# Patient Record
Sex: Female | Born: 1980 | Race: White | Hispanic: No | Smoking: Never smoker
Health system: Southern US, Community
[De-identification: ages and names within clinical notes are randomized; demographics above are authoritative.]

## PROBLEM LIST (undated history)

## (undated) DIAGNOSIS — D649 Anemia, unspecified: Secondary | ICD-10-CM

## (undated) DIAGNOSIS — R011 Cardiac murmur, unspecified: Secondary | ICD-10-CM

## (undated) HISTORY — DX: Cardiac murmur, unspecified: R01.1

## (undated) HISTORY — DX: Anemia, unspecified: D64.9

---

## 2010-05-11 ENCOUNTER — Ambulatory Visit: Payer: Self-pay | Admitting: Family Medicine

## 2010-05-11 DIAGNOSIS — L255 Unspecified contact dermatitis due to plants, except food: Secondary | ICD-10-CM

## 2010-05-18 ENCOUNTER — Ambulatory Visit: Payer: Self-pay | Admitting: Family Medicine

## 2010-05-31 ENCOUNTER — Ambulatory Visit: Payer: Self-pay | Admitting: Family Medicine

## 2010-11-26 NOTE — Assessment & Plan Note (Signed)
Summary: Gina Le/EVM   Vital Signs:  Patient profile:   30 year old female Height:      66 inches Weight:      204 pounds BMI:     33.05 O2 Sat:      100 % on Room air Temp:     97.0 degrees F oral Pulse rate:   100 / minute Pulse rhythm:   regular Resp:     20 per minute BP sitting:   145 / 98  (left arm)  Vitals Entered By: Levonne Spiller EMT-P (May 31, 2010 1:12 PM)  O2 Flow:  Room air  Allergies: No Known Drug Allergies  Family History: Reviewed history from 05/11/2010 and no changes required. Father: alive and well at 20 yoa Mother: alive and well at 75 yoa Siblings: 2 sisters one 46 and one 39 alive and well  Social History: Reviewed history from 05/11/2010 and no changes required. Marital Status: Married Children: 1 son and daughter Occupation: children   Complete Medication List: 1)  Benadryl 25 Mg Tabs (Diphenhydramine hcl) .... Q 6 hours prn 2)  Dexpak 6 Day 1.5 Mg Tabs (Dexamethasone) .... Take as directed 3)  Cephalexin 500 Mg Tabs (Cephalexin) .... Take 1 by mouth every 6 hours for 5 days 4)  Hydroxyzine Hcl 25 Mg Tabs (Hydroxyzine hcl) .... Take 1 by mouth q 8 hours as needed itching, caution: will cause drowsiness 5)  Triamcinolone Acetonide 0.5 % Crea (Triamcinolone acetonide) .... Apply sparingly to rash two times a day caution: avoid putting on face and genital areas 6)  Prednisone 10 Mg  .... Sig 6 tablets day 1&2 5 tablets day 3&4  4 tablets day 5&6 3 tablets day 7&8 2 tablets day 9 &10 1 tablet day 11 & 12 take all to prevent rebound 7)  Prednisone 10 Mg Tabs (Prednisone) .... 5 for 5 days, then 4 for 4 days, 3 for 3 days, 2 for 2 days then 1.  Other Orders: Depo- Medrol 80mg  (J1040) Admin of Therapeutic Inj  intramuscular or subcutaneous (16109)  Patient Instructions: 1)  Wash all clothing that was exposed to the allergen to prevent re-infection. 2)  For relief of itching, use cold compresses to areas, oatmeal bath soaks, and if you were  prescribed topical medicine, store it and use it from the refridgerator.  Prescriptions: TRIAMCINOLONE ACETONIDE 0.5 % CREA (TRIAMCINOLONE ACETONIDE) apply sparingly to rash two times a day Caution: avoid putting on face and genital areas  #40 gm x 0   Entered and Authorized by:   Tacey Ruiz MD   Signed by:   Tacey Ruiz MD on 05/31/2010   Method used:   Electronically to        [facility] [address] (retail)       [address], [address]       [place], [place]  [address]       Ph: [phone]       Fax: [phone]   RxID:   [id number] PREDNISONE 10 MG TABS (PREDNISONE) 5 for 5 days, then 4 for 4 days, 3 for 3 days, 2 for 2 days then 1.  #55 x 0   Entered and Authorized by:   Tacey Ruiz MD   Signed by:   Tacey Ruiz MD on 05/31/2010   Method used:   Electronically to        [facility] [address] (retail)  [address], [address]       [place], [place]  [address]       Ph: [phone]       Fax: [phone]   RxID:   [id number]   Assessment New Problems: CONTACT DERMATITIS DUE TO POISON Le (ICD-692.6)   Plan New Medications/Changes: TRIAMCINOLONE ACETONIDE 0.5 % CREA (TRIAMCINOLONE ACETONIDE) apply sparingly to rash two times a day Caution: avoid putting on face and genital areas  #40 gm x 0, 05/31/2010, Tacey Ruiz MD PREDNISONE 10 MG TABS (PREDNISONE) 5 for 5 days, then 4 for 4 days, 3 for 3 days, 2 for 2 days then 1.  #55 x 0, 05/31/2010, Tacey Ruiz MD  New Orders: Depo- Medrol 80mg  [J1040] Est. Patient Level II [54270] Admin of Therapeutic Inj  intramuscular or subcutaneous [96[address]] Planning Comments:   She was instructed to f/u with dermatology if this recurs. She does not have insurance/has recently applied for Medicaid. Encouraged to again wash fomites.   The patient and/or caregiver has been counseled thoroughly with regard to medications prescribed including dosage, schedule, interactions,  rationale for use, and possible side effects and they verbalize understanding.  Diagnoses and expected course of recovery discussed and will return if not improved as expected or if the condition worsens. Patient and/or caregiver verbalized understanding.       Medication Administration  Injection # 1:    Medication: Depo- Medrol 80mg     Diagnosis: CONTACT DERMATITIS DUE TO POISON Le (ICD-692.6)    Route: IM    Site: RUOQ gluteus    Exp Date: 09/25/2010    Lot #: [id number]    Mfr: PFIZER    Patient tolerated injection without complications    Given by: Levonne Spiller EMT-P (May 31, 2010 1:49 PM)  Orders Added: 1)  Depo- Medrol 80mg  [J1040] 2)  Est. Patient Level II [28315] 3)  Admin of Therapeutic Inj  intramuscular or subcutaneous [96372]  History of Present Illness History from: patient Reason for visit: see chief complaint Chief Complaint: Return of poison Le History of Present Illness: Patient returns today with recurrence of contact dermatitis. She was treated here twice for this and each time she gets to the end of her taper the rash recurrs. She reports that she has washed her clothes and fomites and the kids undress at the door when they come in from outside. She reports that she has not been outside in 3 weeks except to go to the store, etc, not in the yard.     Physical Exam General appearance: well developed, well nourished, uncomfortable secondary to itching. Abdomen: buising and striae present Skin: erythematous mp rash in patches on tops of thighs, flexor surfaces of bilateral arms - diffusely on back and more coelesced on chest and around neck - very erythematous. Some mottling of legs. MSE: oriented to time, place, and person - frustrated   REVIEW OF SYSTEMS Constitutional Symptoms      Denies fever, chills, night sweats, and fatigue.   Respiratory       Denies dry cough, productive cough, wheezing, and shortness of breath.     Skin       Complains of  bruising.   The patient was informed that there is no on-call provider or services available at this clinic during off-hours (when the clinic is closed).  If the patient developed a problem or concern that required immediate attention, the patient was advised to go the  the nearest available urgent care or emergency department for medical care.  The patient verbalized understanding.     The risks, benefits and possible side effects of the treatments and tests were explained clearly to the patient and the patient verbalized understanding.

## 2010-11-26 NOTE — Assessment & Plan Note (Signed)
Summary: POISION OAK- jbb   Vital Signs:  Patient profile:   30 year old female Height:      66.50 inches Weight:      202 pounds BMI:     32.23 O2 Sat:      100 % on Room air Pulse rate:   95 / minute Resp:     16 per minute BP sitting:   130 / 90  (left arm)  Vitals Entered By: Adella Hare LPN (May 18, 2010 2:55 PM)  O2 Flow:  Room air Physical Exam General appearance: well developed, well nourished, moderate  distress Head: normocephalic, atraumatic Extremities: normal extremities Skin: see below MSE: oriented to time, place, and person Patient  now w/ rash still on both forearms  and now on both thighs . There was a rash on the lateral aspect of both thighs and now w/ a new rash on both anterior thighs.   CC: poison oak follow up Is Patient Diabetic? No Pain Assessment Patient in pain? yes     Location: thigh Type: stinging   Chief Complaint:  poison oak follow up.  History of Present Illness: Patient poisin ivy/contact dermatititis has returned w/ a vengence. She ran out of her prednisone on Thursday and today Saturday the rash has reoccurred and is worse. She was only given 21 prednisone tablets that were quickly tapered.   Problems Prior to Update: 1)  Contact Dermatitis Due To Poison Ivy  (ICD-692.6) 2)  Contact Dermatitis&other Eczema Due To Plants  (ICD-692.6)  Medications Prior to Update: 1)  Benadryl 25 Mg Tabs (Diphenhydramine Hcl) .... Q 6 Hours Prn 2)  Dexpak 6 Day 1.5 Mg Tabs (Dexamethasone) .... Take As Directed 3)  Cephalexin 500 Mg Tabs (Cephalexin) .... Take 1 By Mouth Every 6 Hours For 5 Days 4)  Hydroxyzine Hcl 25 Mg Tabs (Hydroxyzine Hcl) .... Take 1 By Mouth Q 8 Hours As Needed Itching, Caution: Will Cause Drowsiness 5)  Triamcinolone Acetonide 0.5 % Crea (Triamcinolone Acetonide) .... Apply Sparingly To Rash Two Times A Day Caution: Avoid Putting On Face and Genital Areas  Current Medications (verified): 1)  Benadryl 25 Mg Tabs  (Diphenhydramine Hcl) .... Q 6 Hours Prn 2)  Dexpak 6 Day 1.5 Mg Tabs (Dexamethasone) .... Take As Directed 3)  Cephalexin 500 Mg Tabs (Cephalexin) .... Take 1 By Mouth Every 6 Hours For 5 Days 4)  Hydroxyzine Hcl 25 Mg Tabs (Hydroxyzine Hcl) .... Take 1 By Mouth Q 8 Hours As Needed Itching, Caution: Will Cause Drowsiness 5)  Triamcinolone Acetonide 0.5 % Crea (Triamcinolone Acetonide) .... Apply Sparingly To Rash Two Times A Day Caution: Avoid Putting On Face and Genital Areas  Allergies (verified): No Known Drug Allergies  Past History:  Past Medical History: Last updated: 05/11/2010 Unremarkable  Family History: Last updated: 05/11/2010 Father: alive and well at 44 yoa Mother: alive and well at 78 yoa Siblings: 2 sisters one 43 and one 51 alive and well  Social History: Last updated: 05/11/2010 Marital Status: Married Children: 1 son and daughter Occupation: children  Family History: Reviewed history from 05/11/2010 and no changes required. Father: alive and well at 61 yoa Mother: alive and well at 67 yoa Siblings: 2 sisters one 38 and one 74 alive and well  Social History: Reviewed history from 05/11/2010 and no changes required. Marital Status: Married Children: 1 son and daughter Occupation: children   Impression & Recommendations:  Problem # 1:  contact dermatitis w/rebound  Complete Medication List: 1)  Benadryl 25 Mg Tabs (Diphenhydramine hcl) .... Q 6 hours prn 2)  Dexpak 6 Day 1.5 Mg Tabs (Dexamethasone) .... Take as directed 3)  Cephalexin 500 Mg Tabs (Cephalexin) .... Take 1 by mouth every 6 hours for 5 days 4)  Hydroxyzine Hcl 25 Mg Tabs (Hydroxyzine hcl) .... Take 1 by mouth q 8 hours as needed itching, caution: will cause drowsiness 5)  Triamcinolone Acetonide 0.5 % Crea (Triamcinolone acetonide) .... Apply sparingly to rash two times a day caution: avoid putting on face and genital areas 6)  Prednisone 10 Mg  .... Sig 6 tablets day 1&2 5 tablets day  3&4  4 tablets day 5&6 3 tablets day 7&8 2 tablets day 9 &10 1 tablet day 11 & 12 take all to prevent rebound  Other Orders: Solumedrol up to 125mg  (Z6109) Admin of Therapeutic Inj  intramuscular or subcutaneous ([address])  Patient Instructions: 1)  Please schedule a follow-up appointment as needed. 2)  Please schedule an appointment with your primary doctor in 1-2 weeks if not better.  3)  Use the prednisone for a full 12 days. Prescriptions: PREDNISONE 10 MG Sig 6 tablets day 1&2 5 tablets day 3&4  4 tablets day 5&6 3 tablets day 7&8 2 tablets day 9 &10 1 tablet day 11 & 12 take all to prevent rebound  #42 x 0   Entered and Authorized by:   Hassan Rowan MD   Signed by:   Hassan Rowan MD on 05/18/2010   Method used:   Printed then faxed to ...       [facility] [address] (retail)       [address], [address]       [place], [place]  [address]       Ph: [phone]       Fax: [phone]   RxID:   [id number]    Medication Administration  Injection # 1:    Medication: Solumedrol up to 125mg     Diagnosis: CONTACT DERMATITIS DUE TO POISON IVY (ICD-692.6)    Route: IM    Site: RUOQ gluteus    Exp Date: 1/14    Lot #: [id number]    Mfr: pfizer    Patient tolerated injection without complications    Given by: Adella Hare LPN (May 18, 2010 3:29 PM)  Orders Added: 1)  Est. Patient Level III [27253] 2)  Solumedrol up to 125mg  [J2930] 3)  Admin of Therapeutic Inj  intramuscular or subcutaneous [66440]

## 2010-11-26 NOTE — Assessment & Plan Note (Signed)
Summary: POISON IVY OR POISON OAK/EVM   Vital Signs:  Patient Profile:   30 Years Old Female CC:      Rash Height:     66.50 inches Weight:      202 pounds BMI:     32.23 BSA:     2.02 Temp:     99 degrees F oral Pulse rate:   76 / minute Pulse rhythm:   regular Resp:     18 per minute BP sitting:   120 / 80  (left arm) Cuff size:   large  Pt. in pain?   no  Vitals Entered By: Providence Crosby LPN (May 11, 2010 1:06 PM)                   Current Allergies: No known allergies History of Present Illness History from: patient Reason for visit: rash with blisters Chief Complaint: Rash History of Present Illness: rash started 2 days ago weeding roses at home in a bushy area. Pt immediately began to notice skin irritation and  blistered later that day.  Pt says that she scratched the areas initially and then they spread to other areas on the body.  Patient says that she tried using OTC benadryl creme and tablets but the itching became worse, rash spread and started weeping a honey colored liquid.  Pt denies fever and chills, nausea and vomiting and diarrhea.    Current Meds BENADRYL 25 MG TABS (DIPHENHYDRAMINE HCL) q 6 hours prn DEXPAK 6 DAY 1.5 MG TABS (DEXAMETHASONE) take as directed CEPHALEXIN 500 MG TABS (CEPHALEXIN) take 1 by mouth every 6 hours for 5 days HYDROXYZINE HCL 25 MG TABS (HYDROXYZINE HCL) take 1 by mouth q 8 hours as needed itching, CAUTION: will cause drowsiness TRIAMCINOLONE ACETONIDE 0.5 % CREA (TRIAMCINOLONE ACETONIDE) apply sparingly to rash two times a day Caution: avoid putting on face and genital areas  REVIEW OF SYSTEMS Constitutional Symptoms      Denies fever, chills, night sweats, weight loss, weight gain, and fatigue.  Eyes       Denies change in vision, eye pain, eye discharge, glasses, contact lenses, and eye surgery. Ear/Nose/Throat/Mouth       Denies hearing loss/aids, change in hearing, ear pain, ear discharge, dizziness, frequent runny nose,  frequent nose bleeds, sinus problems, sore throat, hoarseness, and tooth pain or bleeding.  Respiratory       Denies dry cough, productive cough, wheezing, shortness of breath, asthma, bronchitis, and emphysema/COPD.  Cardiovascular       Denies murmurs, chest pain, and tires easily with exhertion.    Gastrointestinal       Denies stomach pain, nausea/vomiting, diarrhea, constipation, blood in bowel movements, and indigestion. Genitourniary       Denies painful urination, kidney stones, and loss of urinary control. Neurological       Denies paralysis, seizures, and fainting/blackouts. Musculoskeletal       Denies muscle pain, joint pain, joint stiffness, decreased range of motion, redness, swelling, muscle weakness, and gout.  Skin       Complains of hair/skni or nail changes.      Denies bruising and unusual mles/lumps or sores.      Comments: vesicular rash on thighs, arms and wrists Psych       Denies mood changes, temper/anger issues, anxiety/stress, speech problems, depression, and sleep problems.  Past History:  Past Medical History: Unremarkable  Family History: Father: alive and well at 65 yoa Mother: alive and well at 81 yoa  Siblings: 2 sisters one 75 and one 60 alive and well  Social History: Marital Status: Married Children: 1 son and daughter Occupation: children Physical Exam General appearance: well developed, well nourished, no acute distress Head: normocephalic, atraumatic Eyes: conjunctivae and lids normal Pupils: equal, round, reactive to light Ears: normal, no lesions or deformities Nasal: mucosa pink, nonedematous, no septal deviation, turbinates normal Oral/Pharynx: tongue normal, posterior pharynx without erythema or exudate Neck: neck supple,  trachea midline, no masses Chest/Lungs: no rales, wheezes, or rhonchi bilateral, breath sounds equal without effort Heart: regular rate and  rhythm, no murmur Abdomen: soft, non-tender without obvious  organomegaly Extremities: normal extremities Neurological: grossly intact and non-focal Skin: vesicular rash over wrists, arms, thighs, weeping of rash on left arm - honey colored serous drainage; typical poison ivy appearance MSE: oriented to time, place, and person Assessment New Problems: CONTACT DERMATITIS DUE TO POISON IVY (ICD-692.6) CONTACT DERMATITIS&OTHER ECZEMA DUE TO PLANTS (ICD-692.6) IMPETIGO (ICD-684) PRURITUS (ICD-698.9) ? 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 Demonstrates willingness to comply.  Plan New Medications/Changes: TRIAMCINOLONE ACETONIDE 0.5 % CREA (TRIAMCINOLONE ACETONIDE) apply sparingly to rash two times a day Caution: avoid putting on face and genital areas  #40 gm x 0, 05/11/2010, Emersyn Wyss MD HYDROXYZINE HCL 25 MG TABS (HYDROXYZINE HCL) take 1 by mouth q 8 hours as needed itching, CAUTION: will cause drowsiness  #15 x 0, 05/11/2010, Zeda Gangwer MD CEPHALEXIN 500 MG TABS (CEPHALEXIN) take 1 by mouth every 6 hours for 5 days  #20 x 0, 05/11/2010, Clarann Helvey MD DEXPAK 6 DAY 1.5 MG TABS (DEXAMETHASONE) take as directed  #1 x 0, 05/11/2010, Lekeshia Kram MD  New Orders: New Patient Level II [99202] Solumedrol up to 125mg  [J2930] Follow Up: Follow up in 2-3 days if no improvement, Follow up with Primary Physician  The patient and/or caregiver has been counseled thoroughly with regard to medications prescribed including dosage, schedule, interactions, rationale for use, and possible side effects and they verbalize understanding.  Diagnoses and expected course of recovery discussed and will return if not improved as expected or if the condition worsens. Patient and/or caregiver verbalized understanding.  Prescriptions: TRIAMCINOLONE ACETONIDE 0.5 % CREA (TRIAMCINOLONE ACETONIDE) apply sparingly to rash two times a day Caution: avoid putting on face and genital areas   #40 gm x 0   Entered and Authorized by:   Standley Dakins MD   Signed by:   Standley Dakins MD on 05/11/2010   Method used:   Electronically to        [facility] [address] (retail)       [address], [address]       [place], [place]  [address]       Ph: [phone]       Fax: [phone]   RxID:   [id number] HYDROXYZINE HCL 25 MG TABS (HYDROXYZINE HCL) take 1 by mouth q 8 hours as needed itching, CAUTION: will cause drowsiness  #15 x 0   Entered and Authorized by:   Standley Dakins MD   Signed by:   Standley Dakins MD on 05/11/2010   Method used:   Electronically to        [facility] [address] (retail)       [address], [address]       [place], [place]  [address]  Ph: [phone]       Fax: [phone]   RxID:   [id number] CEPHALEXIN 500 MG TABS (CEPHALEXIN) take 1 by mouth every 6 hours for 5 days  #20 x 0   Entered and Authorized by:   Standley Dakins MD   Signed by:   Standley Dakins MD on 05/11/2010   Method used:   Electronically to        [facility] [address] (retail)       [address], [address]       [place], [place]  [address]       Ph: [phone]       Fax: [phone]   RxID:   [id number] DEXPAK 6 DAY 1.5 MG TABS (DEXAMETHASONE) take as directed  #1 x 0   Entered and Authorized by:   Standley Dakins MD   Signed by:   Standley Dakins MD on 05/11/2010   Method used:   Electronically to        [facility] [address] (retail)       [address], [address]       [place], [place]  [address]       Ph: [phone]       Fax: [phone]   RxID:   [id number]   Patient Instructions: 1)  Avoid putting steroid creme on face and genital areas. 2)  Try to avoid scratching the lesions. 3)  Please schedule an appointment with your primary doctor in : 1 week 4)  There is no on-call provider or services  available at this clinic during off-hours (when the clinic is closed).  If the patient developed a problem or concern that required immediate attention, the patient was advised to go the the nearest available urgent care or emergency department for medical care.   5)  You can return if no improvement.    Medication Administration  Injection # 1:    Medication: Solumedrol up to 125mg     Diagnosis: contact dermatitis    Route: IM    Site: LUOQ gluteus    Exp Date: 10/2012    Lot #: [id number]    Mfr: pfizer    Patient tolerated injection without complications    Given by: Providence Crosby LPN (May 11, 2010 1:35 PM)  Orders Added: 1)  New Patient Level II [99202] 2)  Solumedrol up to 125mg  [J2930]   Medication Administration  Injection # 1:    Medication: Solumedrol up to 125mg     Diagnosis: contact dermatitis    Route: IM    Site: LUOQ gluteus    Exp Date: 10/2012    Lot #: [id number]    Mfr: pfizer    Patient tolerated injection without complications    Given by: Providence Crosby LPN (May 11, 2010 1:35 PM)  Orders Added: 1)  New Patient Level II [99202] 2)  Solumedrol up to 125mg  [J2930] We monitored the patient for 15 mins after the injection to be sure she tolerated it well.  The patient did tolerate it well.  CLJ.   The risks, benefits and possible side effects were clearly explained and discussed with the patient.  The patient verbalized clear understanding.  The patient was given instructions to return if symptoms don't improve, worsen or new changes develop.  If it is not during clinic hours and the patient cannot get back to this clinic then the  patient was told to seek medical care at an available urgent care or emergency department.  The patient verbalized understanding.    The patient was informed that there is no on-call provider or services available at this clinic during off-hours (when the clinic is closed).  If the patient developed a problem or concern that required  immediate attention, the patient was advised to go the the nearest available urgent care or emergency department for medical care.  The patient verbalized understanding.     The patient's prescriptions were checked for possible interactions and electronically sent to the pharmacy of choice.    It was clearly explained to the patient that this [facility] is not intended to be a primary care clinic.  The patient is always better served by the continuity of care and the provider/patient relationships developed with their dedicated primary care provider.  The patient was told to be sure to follow up as soon as possible with their primary care provider to discuss treatments received and to receive further examination and testing.  The patient verbalized understanding. The will f/u with PCP ASAP.   Rodney Langton, M.D., F.A.A.F.P.  May 11, 2010

## 2011-03-31 ENCOUNTER — Ambulatory Visit: Payer: Self-pay | Admitting: Internal Medicine

## 2012-03-11 ENCOUNTER — Encounter: Payer: Self-pay | Admitting: Obstetrics and Gynecology

## 2012-07-08 IMAGING — US ULTRASOUND RIGHT BREAST
1 series · 10 of 10 positions shown · non-contrast
Comparison: none

REASON FOR EXAM: RT BRST POSS CYST 6 OCLOCK  US IF NEEDED
COMMENTS:

PROCEDURE:     US  - US BREAST RIGHT  - March 31, 2011  [DATE]
RESULT:
HISTORY: 30-year-old female presents with right breast palpable abnormality
at the 6 o'clock position.

[Series 1: ultrasound right breast · 10 of 10 slices shown]
[im 1/10]
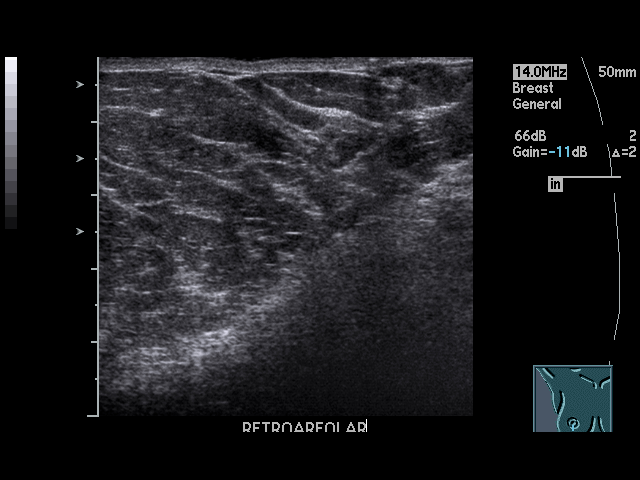
[im 2/10]
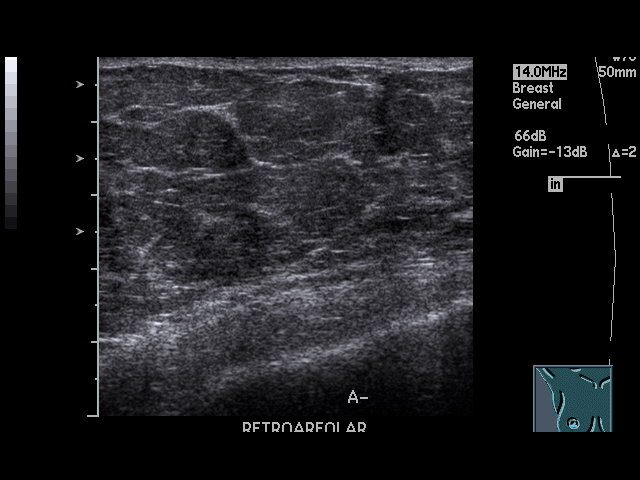
[im 3/10]
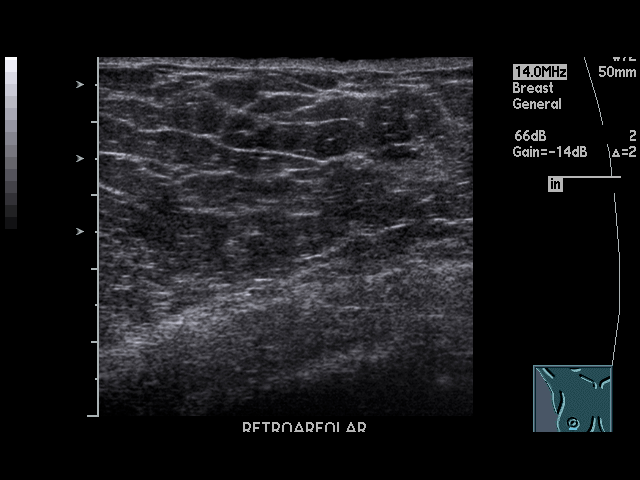
[im 4/10]
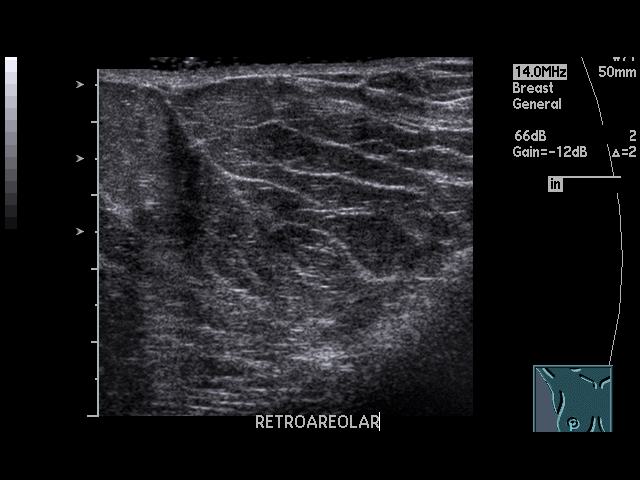
[im 5/10]
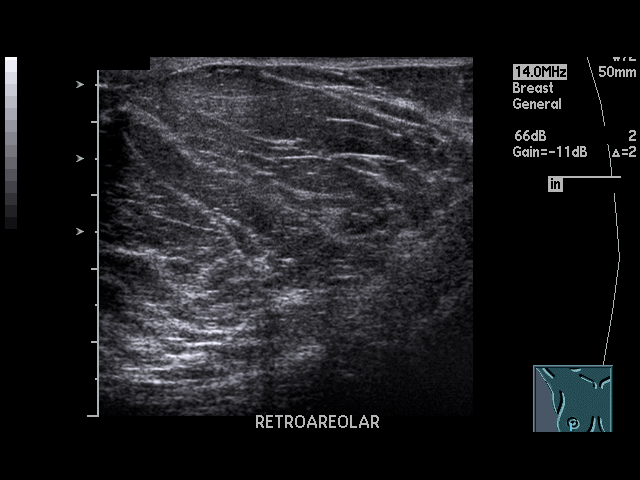
[im 6/10]
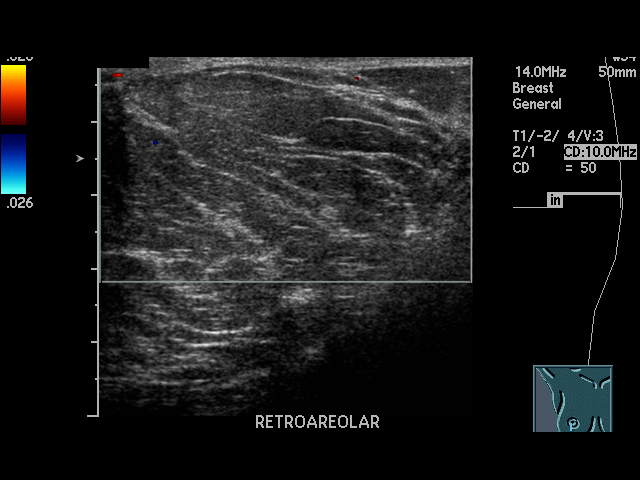
[im 7/10]
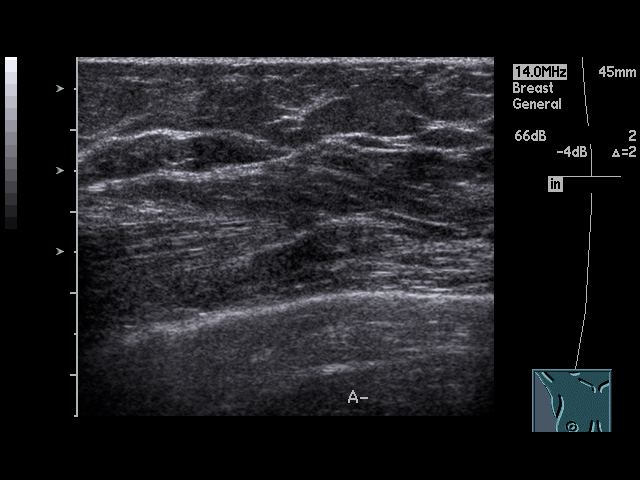
[im 8/10]
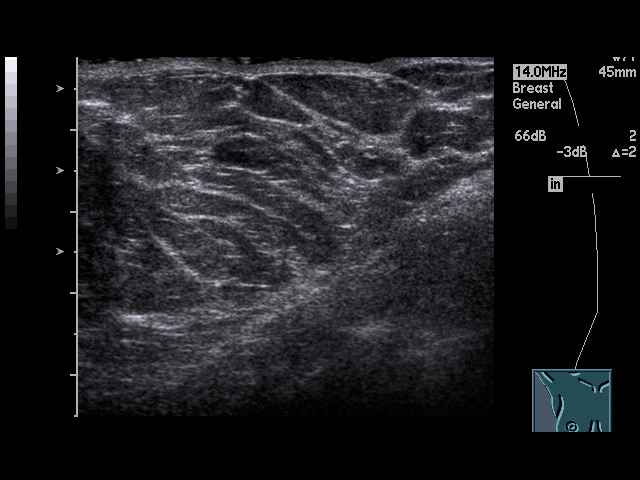
[im 9/10]
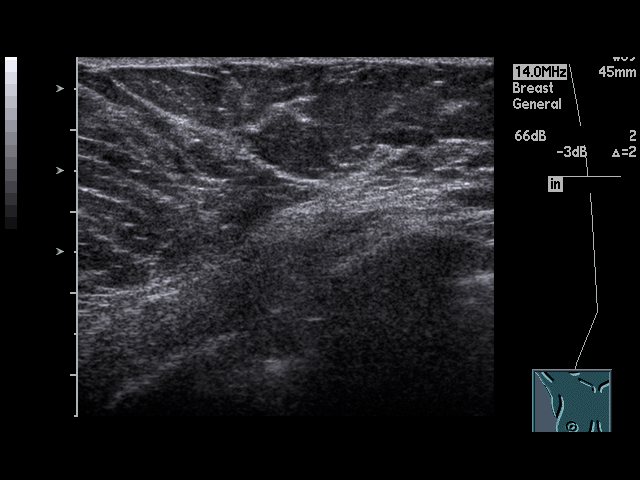
[im 10/10]
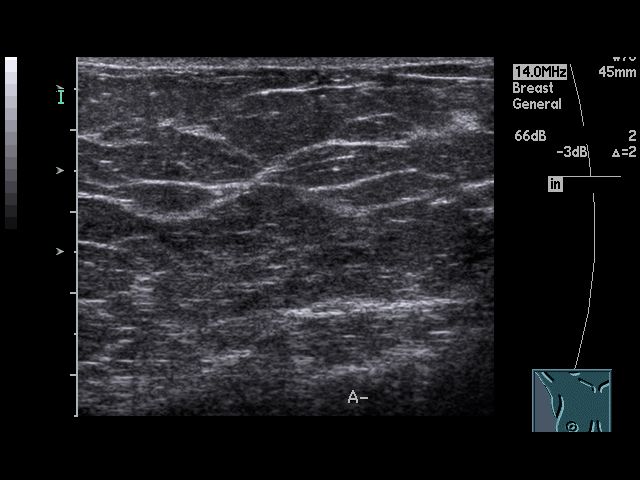

[10 of 10 positions shown; findings below may reference images not displayed]

FINDINGS: Bilateral breasts demonstrate a primarily fat density. There is no
comparison examination.

There is no dominant mass, architectural distortion or clusters of
suspicious microcalcifications. Further evaluation was performed with
real-time sonography of the right breast at the 6 o'clock position which is
the site of palpable abnormality as confirmed by the patient. There is no
solid or cystic mass. There is no architectural distortion.
IMPRESSION: No sonographic or mammographic abnormality at the site of
palpable abnormality. Negative bilateral mammogram.

BI-RADS: Category 1 - Negative

## 2012-09-24 ENCOUNTER — Inpatient Hospital Stay: Payer: Self-pay | Admitting: Obstetrics and Gynecology

## 2012-09-24 LAB — CBC WITH DIFFERENTIAL/PLATELET
Basophil #: 0.1 10*3/uL (ref 0.0–0.1)
Eosinophil #: 0.1 10*3/uL (ref 0.0–0.7)
HCT: 35.9 % (ref 35.0–47.0)
Lymphocyte #: 2.1 10*3/uL (ref 1.0–3.6)
MCH: 30.7 pg (ref 26.0–34.0)
MCHC: 34.4 g/dL (ref 32.0–36.0)
MCV: 89 fL (ref 80–100)
Monocyte #: 1 x10 3/mm — ABNORMAL HIGH (ref 0.2–0.9)
Monocyte %: 8.5 %
Neutrophil #: 8.9 10*3/uL — ABNORMAL HIGH (ref 1.4–6.5)
RDW: 13.3 % (ref 11.5–14.5)
WBC: 12.1 10*3/uL — ABNORMAL HIGH (ref 3.6–11.0)

## 2013-06-19 IMAGING — US US OB NUCHAL TRANSLUCENCY 1ST GEST - [facility] NRPT
1 series · 14 of 28 positions shown · non-contrast
Comparison: none

[Series 1: us ob nuchal translucency 1st gest - [facility] nrpt · 0.28mm/px · 14 of 33 slices shown]
[im 2/33]
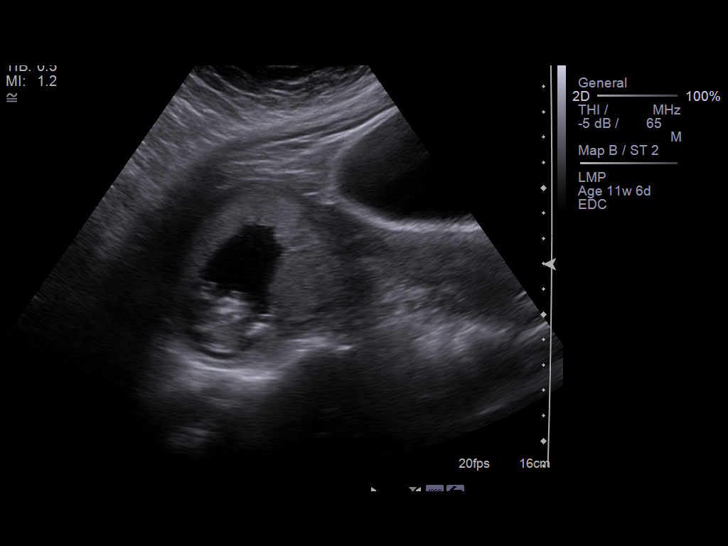
[im 4/33]
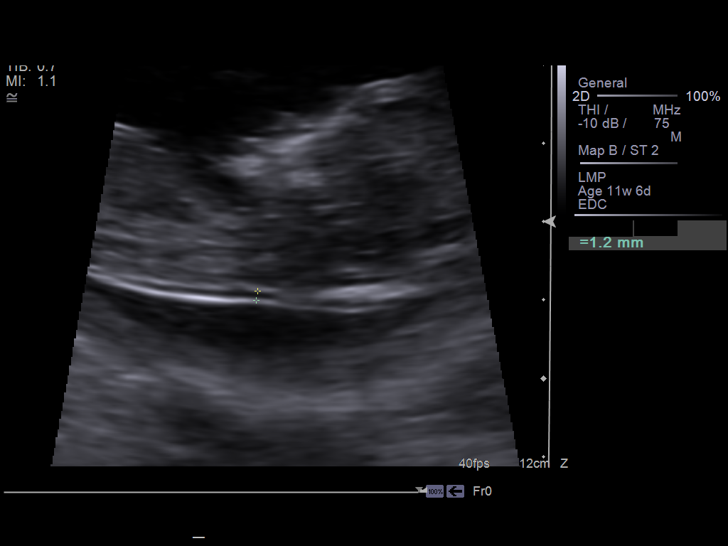
[im 6/33]
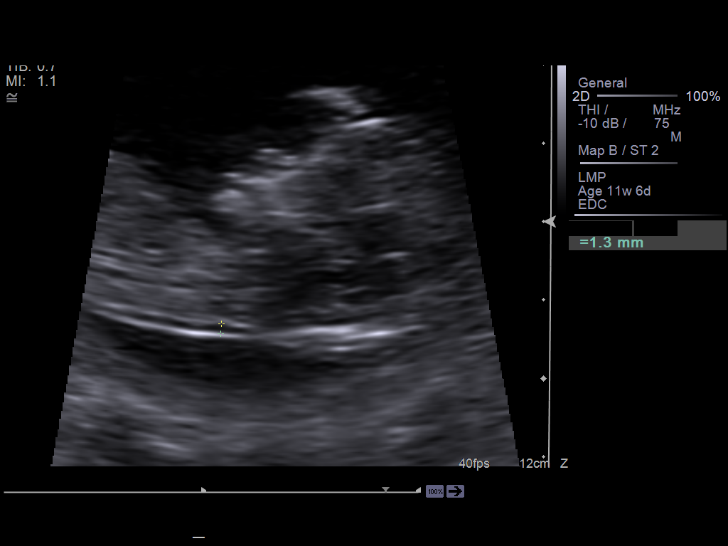
[im 9/33]
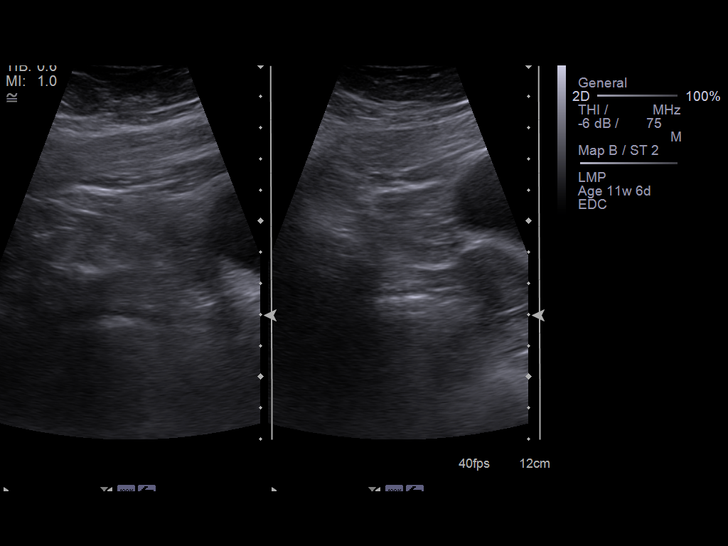
[im 11/33]
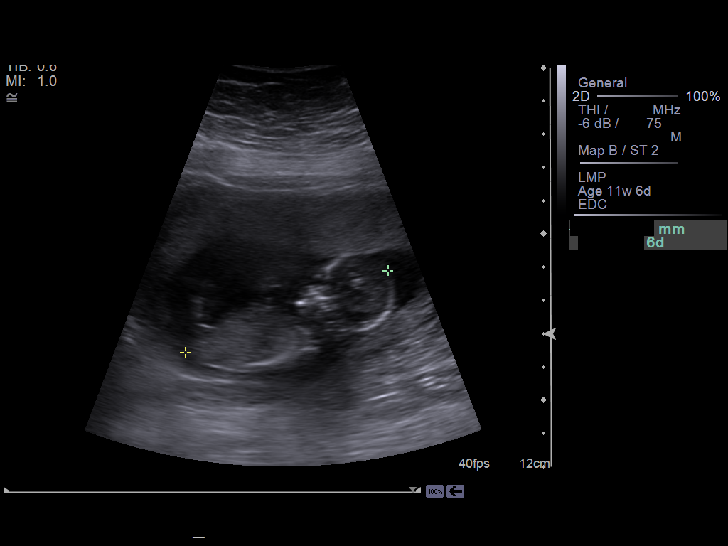
[im 14/33]
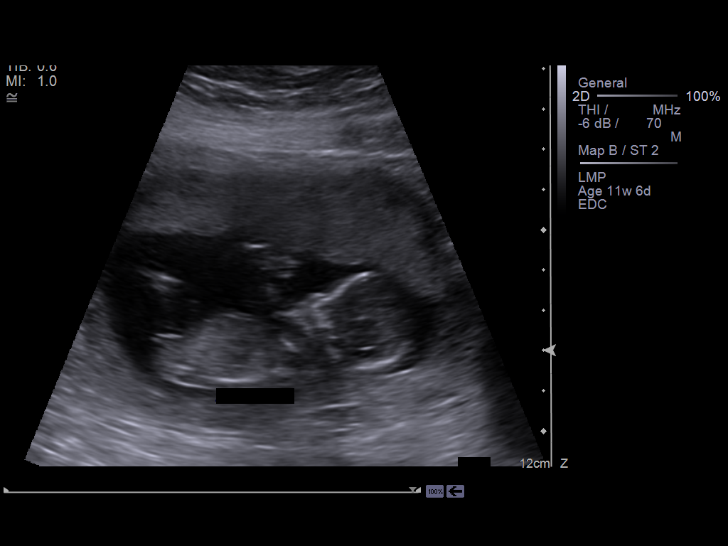
[im 16/33]
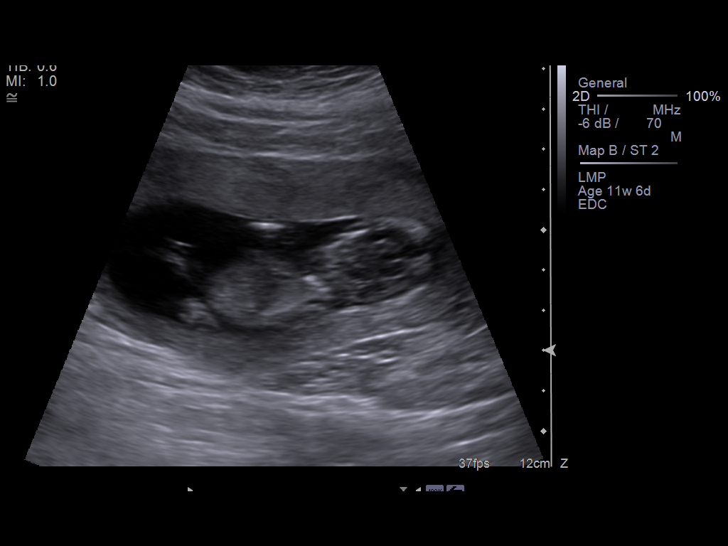
[im 18/33]
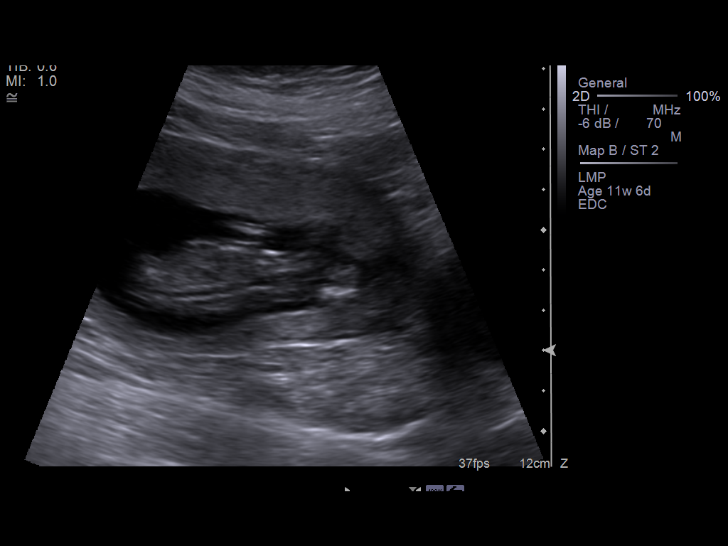
[im 21/33]
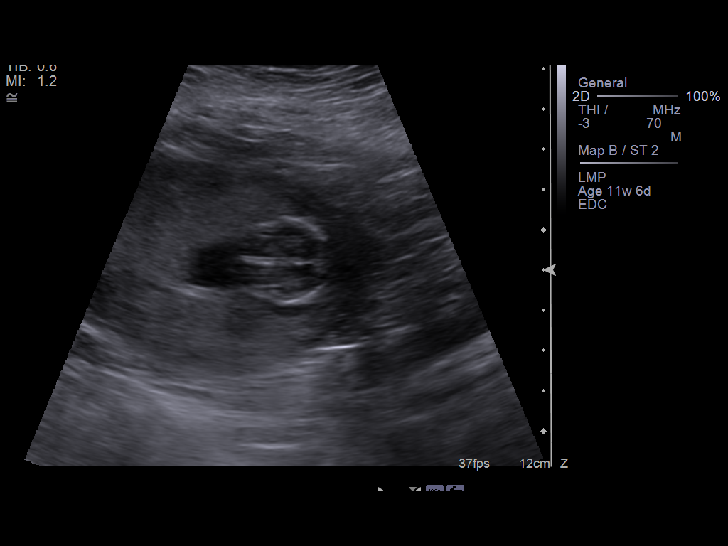
[im 23/33]
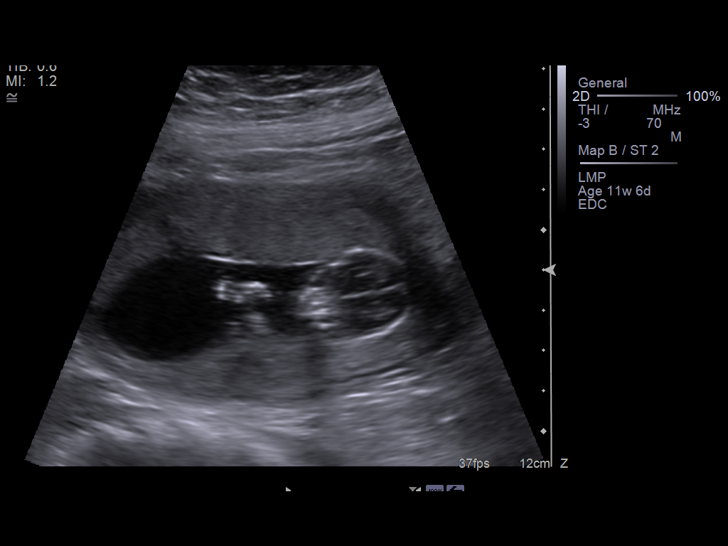
[im 25/33]
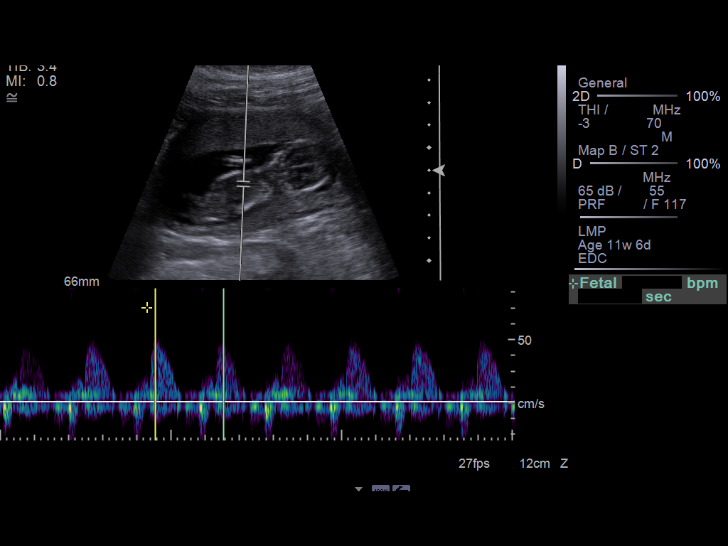
[im 28/33]
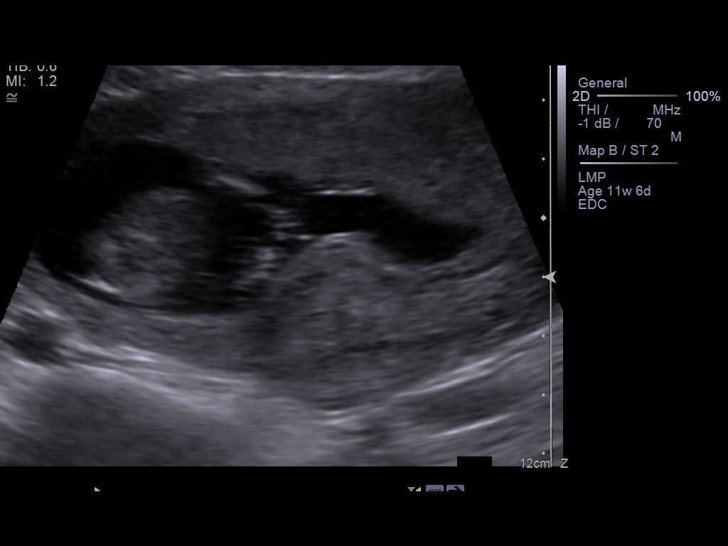
[im 30/33]
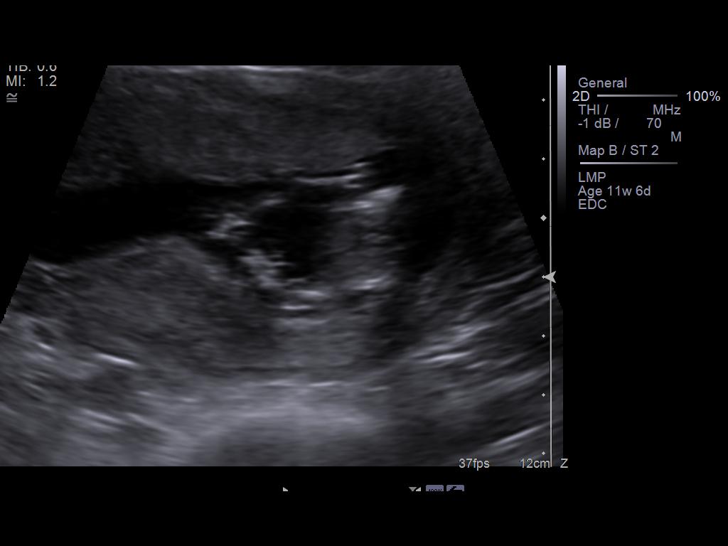
[im 33/33]
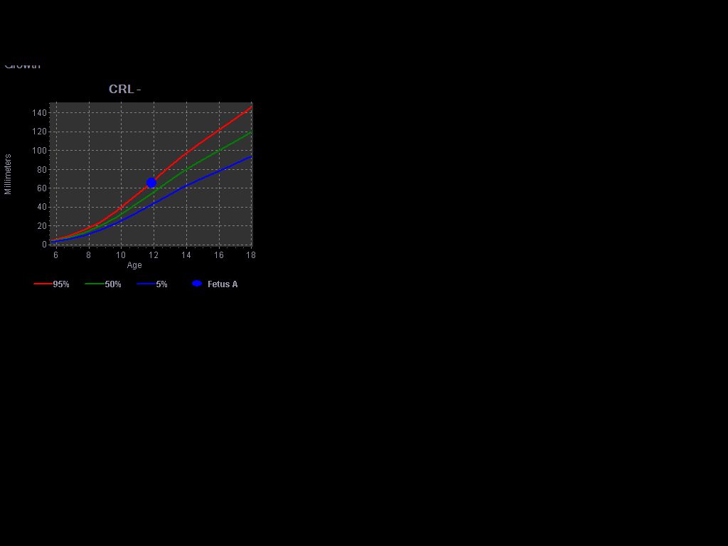

[14 of 28 positions shown; findings below may reference images not displayed]

IMAGES IMPORTED FROM THE [employer] WORKFLOW SYSTEM
NO DICTATION FOR STUDY

## 2014-01-02 ENCOUNTER — Ambulatory Visit: Payer: Self-pay | Admitting: Family Medicine

## 2014-01-02 LAB — RAPID STREP-A WITH REFLX: Micro Text Report: NEGATIVE

## 2014-01-05 LAB — BETA STREP CULTURE(ARMC)

## 2015-03-06 NOTE — H&P (Signed)
L&D Evaluation:  History:   HPI 34 y/o G3P2002 @ 39/5wks EDC 09/24/12 arrives with c/o increasing pelviic pressure throughout the day, denies leaking fluid or vaginal bleeding, baby moving well. Care @ [facility] well pregnancy, obesity, GBS negative.    Presents with abdominal pain    Patient's Medical History No Chronic Illness    Patient's Surgical History none    Medications Pre Natal Vitamins    Allergies NKDA    Social History none    Family History Non-Contributory   ROS:   ROS All systems were reviewed.  HEENT, CNS, GI, GU, Respiratory, CV, Renal and Musculoskeletal systems were found to be normal.   Exam:   Vital Signs stable    Urine Protein not completed    General no apparent distress    Mental Status clear    Chest clear    Heart normal sinus rhythm    Abdomen gravid, non-tender    Estimated Fetal Weight Average for gestational age    Fetal Position vtx    Fundal Height term    Back no CVAT    Edema 1+  pedal    Reflexes 2+    Clonus negative    Pelvic no external lesions, 6cm BBOW sm show vtx @ -1    Mebranes Intact    FHT normal rate with no decels, baseline 120's 130-'s avg variability with accels    FHT Description 136    Ucx regular, EFM just applied    Ucx Frequency 60 min    Lymph no lymphadenopathy   Impression:   Impression active labor   Plan:   Plan EFM/NST, monitor contractions and for cervical change    Comments Admitted, knows what to expect 4th baby. requests spidural anesthesia notified. Stadol given after consents- while waiting. Husband at bedside, supportive.   Electronic Signatures: Albertina ParrLugiano, Mandel Seiden B (CNM)  (Signed [phone]-Nov-13 14:47)  Authored: L&D Evaluation   Last Updated: 29-Nov-13 14:47 by Albertina ParrLugiano, Paola Flynt B (CNM)

## 2015-09-08 ENCOUNTER — Encounter: Payer: Self-pay | Admitting: Emergency Medicine

## 2015-09-08 ENCOUNTER — Ambulatory Visit
Admission: EM | Admit: 2015-09-08 | Discharge: 2015-09-08 | Disposition: A | Discharge status: Home / Self Care | Payer: BLUE CROSS/BLUE SHIELD | Attending: Family Medicine | Admitting: Family Medicine

## 2015-09-08 DIAGNOSIS — J0101 Acute recurrent maxillary sinusitis: Secondary | ICD-10-CM | POA: Diagnosis not present

## 2015-09-08 DIAGNOSIS — J039 Acute tonsillitis, unspecified: Secondary | ICD-10-CM | POA: Diagnosis not present

## 2015-09-08 DIAGNOSIS — H6593 Unspecified nonsuppurative otitis media, bilateral: Secondary | ICD-10-CM

## 2015-09-08 LAB — RAPID STREP SCREEN (MED CTR MEBANE ONLY): Streptococcus, Group A Screen (Direct): NEGATIVE

## 2015-09-08 MED ORDER — ACETAMINOPHEN 500 MG PO TABS: 1000.0000 mg | ORAL_TABLET | Freq: Four times a day (QID) | ORAL | Status: AC | PRN

## 2015-09-08 MED ORDER — SALINE SPRAY 0.65 % NA SOLN
2.0000 | NASAL | Status: DC
Start: 2015-09-08 — End: 2017-05-18

## 2015-09-08 MED ORDER — FLUTICASONE PROPIONATE 50 MCG/ACT NA SUSP: 1.0000 | Freq: Two times a day (BID) | NASAL | Status: DC

## 2015-09-08 NOTE — Discharge Instructions (Signed)
Sinusitis, Adult °Sinusitis is redness, soreness, and inflammation of the paranasal sinuses. Paranasal sinuses are air pockets within the bones of your face. They are located beneath your eyes, in the middle of your forehead, and above your eyes. In healthy paranasal sinuses, mucus is able to drain out, and air is able to circulate through them by way of your nose. However, when your paranasal sinuses are inflamed, mucus and air can become trapped. This can allow bacteria and other germs to grow and cause infection. °Sinusitis can develop quickly and last only a short time (acute) or continue over a long period (chronic). Sinusitis that lasts for more than 12 weeks is considered chronic. °CAUSES °Causes of sinusitis include: °· Allergies. °· Structural abnormalities, such as displacement of the cartilage that separates your nostrils (deviated septum), which can decrease the air flow through your nose and sinuses and affect sinus drainage. °· Functional abnormalities, such as when the small hairs (cilia) that line your sinuses and help remove mucus do not work properly or are not present. °SIGNS AND SYMPTOMS °Symptoms of acute and chronic sinusitis are the same. The primary symptoms are pain and pressure around the affected sinuses. Other symptoms include: °· Upper toothache. °· Earache. °· Headache. °· Bad breath. °· Decreased sense of smell and taste. °· A cough, which worsens when you are lying flat. °· Fatigue. °· Fever. °· Thick drainage from your nose, which often is green and may contain pus (purulent). °· Swelling and warmth over the affected sinuses. °DIAGNOSIS °Your health care provider will perform a physical exam. During your exam, your health care provider may perform any of the following to help determine if you have acute sinusitis or chronic sinusitis: °· Look in your nose for signs of abnormal growths in your nostrils (nasal polyps). °· Tap over the affected sinus to check for signs of  infection. °· View the inside of your sinuses using an imaging device that has a light attached (endoscope). °If your health care provider suspects that you have chronic sinusitis, one or more of the following tests may be recommended: °· Allergy tests. °· Nasal culture. A sample of mucus is taken from your nose, sent to a lab, and screened for bacteria. °· Nasal cytology. A sample of mucus is taken from your nose and examined by your health care provider to determine if your sinusitis is related to an allergy. °TREATMENT °Most cases of acute sinusitis are related to a viral infection and will resolve on their own within 10 days. Sometimes, medicines are prescribed to help relieve symptoms of both acute and chronic sinusitis. These may include pain medicines, decongestants, nasal steroid sprays, or saline sprays. °However, for sinusitis related to a bacterial infection, your health care provider will prescribe antibiotic medicines. These are medicines that will help kill the bacteria causing the infection. °Rarely, sinusitis is caused by a fungal infection. In these cases, your health care provider will prescribe antifungal medicine. °For some cases of chronic sinusitis, surgery is needed. Generally, these are cases in which sinusitis recurs more than 3 times per year, despite other treatments. °HOME CARE INSTRUCTIONS °· Drink plenty of water. Water helps thin the mucus so your sinuses can drain more easily. °· Use a humidifier. °· Inhale steam 3-4 times a day (for example, sit in the bathroom with the shower running). °· Apply a warm, moist washcloth to your face 3-4 times a day, or as directed by your health care provider. °· Use saline nasal sprays to help   moisten and clean your sinuses.  Take medicines only as directed by your health care provider.  If you were prescribed either an antibiotic or antifungal medicine, finish it all even if you start to feel better. SEEK IMMEDIATE MEDICAL CARE IF:  You have  increasing pain or severe headaches.  You have nausea, vomiting, or drowsiness.  You have swelling around your face.  You have vision problems.  You have a stiff neck.  You have difficulty breathing.   This information is not intended to replace advice given to you by your health care provider. Make sure you discuss any questions you have with your health care provider.   Document Released: 10/13/2005 Document Revised: 11/03/2014 Document Reviewed: 10/28/2011 Elsevier Interactive Patient Education 2016 [employer]  Sinusitis, Adult Sinusitis is redness, soreness, and inflammation of the paranasal sinuses. Paranasal sinuses are air pockets within the bones of your face. They are located beneath your eyes, in the middle of your forehead, and above your eyes. In healthy paranasal sinuses, mucus is able to drain out, and air is able to circulate through them by way of your nose. However, when your paranasal sinuses are inflamed, mucus and air can become trapped. This can allow bacteria and other germs to grow and cause infection. Sinusitis can develop quickly and last only a short time (acute) or continue over a long period (chronic). Sinusitis that lasts for more than 12 weeks is considered chronic. CAUSES Causes of sinusitis include:  Allergies.  Structural abnormalities, such as displacement of the cartilage that separates your nostrils (deviated septum), which can decrease the air flow through your nose and sinuses and affect sinus drainage.  Functional abnormalities, such as when the small hairs (cilia) that line your sinuses and help remove mucus do not work properly or are not present. SIGNS AND SYMPTOMS Symptoms of acute and chronic sinusitis are the same. The primary symptoms are pain and pressure around the affected sinuses. Other symptoms include:  Upper toothache.  Earache.  Headache.  Bad breath.  Decreased sense of smell and taste.  A cough, which worsens when  you are lying flat.  Fatigue.  Fever.  Thick drainage from your nose, which often is green and may contain pus (purulent).  Swelling and warmth over the affected sinuses. DIAGNOSIS Your health care provider will perform a physical exam. During your exam, your health care provider may perform any of the following to help determine if you have acute sinusitis or chronic sinusitis:  Look in your nose for signs of abnormal growths in your nostrils (nasal polyps).  Tap over the affected sinus to check for signs of infection.  View the inside of your sinuses using an imaging device that has a light attached (endoscope). If your health care provider suspects that you have chronic sinusitis, one or more of the following tests may be recommended:  Allergy tests.  Nasal culture. A sample of mucus is taken from your nose, sent to a lab, and screened for bacteria.  Nasal cytology. A sample of mucus is taken from your nose and examined by your health care provider to determine if your sinusitis is related to an allergy. TREATMENT Most cases of acute sinusitis are related to a viral infection and will resolve on their own within 10 days. Sometimes, medicines are prescribed to help relieve symptoms of both acute and chronic sinusitis. These may include pain medicines, decongestants, nasal steroid sprays, or saline sprays. However, for sinusitis related to a bacterial infection, your health care  provider will prescribe antibiotic medicines. These are medicines that will help kill the bacteria causing the infection. Rarely, sinusitis is caused by a fungal infection. In these cases, your health care provider will prescribe antifungal medicine. For some cases of chronic sinusitis, surgery is needed. Generally, these are cases in which sinusitis recurs more than 3 times per year, despite other treatments. HOME CARE INSTRUCTIONS  Drink plenty of water. Water helps thin the mucus so your sinuses can drain  more easily.  Use a humidifier.  Inhale steam 3-4 times a day (for example, sit in the bathroom with the shower running).  Apply a warm, moist washcloth to your face 3-4 times a day, or as directed by your health care provider.  Use saline nasal sprays to help moisten and clean your sinuses.  Take medicines only as directed by your health care provider.  If you were prescribed either an antibiotic or antifungal medicine, finish it all even if you start to feel better. SEEK IMMEDIATE MEDICAL CARE IF:  You have increasing pain or severe headaches.  You have nausea, vomiting, or drowsiness.  You have swelling around your face.  You have vision problems.  You have a stiff neck.  You have difficulty breathing.   This information is not intended to replace advice given to you by your health care provider. Make sure you discuss any questions you have with your health care provider.   Document Released: 10/13/2005 Document Revised: 11/03/2014 Document Reviewed: 10/28/2011 Elsevier Interactive Patient Education 2016 [employer] Tonsillitis Tonsillitis is an infection of the throat that causes the tonsils to become red, tender, and swollen. Tonsils are collections of lymphoid tissue at the back of the throat. Each tonsil has crevices (crypts). Tonsils help fight nose and throat infections and keep infection from spreading to other parts of the body for the first 18 months of life.  CAUSES Sudden (acute) tonsillitis is usually caused by infection with streptococcal bacteria. Long-lasting (chronic) tonsillitis occurs when the crypts of the tonsils become filled with pieces of food and bacteria, which makes it easy for the tonsils to become repeatedly infected. SYMPTOMS  Symptoms of tonsillitis include:  A sore throat, with possible difficulty swallowing.  White patches on the tonsils.  Fever.  Tiredness.  New episodes of snoring during sleep, when you did not snore  before.  Small, foul-smelling, yellowish-white pieces of material (tonsilloliths) that you occasionally cough up or spit out. The tonsilloliths can also cause you to have bad breath. DIAGNOSIS Tonsillitis can be diagnosed through a physical exam. Diagnosis can be confirmed with the results of lab tests, including a throat culture. TREATMENT  The goals of tonsillitis treatment include the reduction of the severity and duration of symptoms and prevention of associated conditions. Symptoms of tonsillitis can be improved with the use of steroids to reduce the swelling. Tonsillitis caused by bacteria can be treated with antibiotic medicines. Usually, treatment with antibiotic medicines is started before the cause of the tonsillitis is known. However, if it is determined that the cause is not bacterial, antibiotic medicines will not treat the tonsillitis. If attacks of tonsillitis are severe and frequent, your health care provider may recommend surgery to remove the tonsils (tonsillectomy). HOME CARE INSTRUCTIONS   Rest as much as possible and get plenty of sleep.  Drink plenty of fluids. While the throat is very sore, eat soft foods or liquids, such as sherbet, soups, or instant breakfast drinks.  Eat frozen ice pops.  Gargle with a warm or  cold liquid to help soothe the throat. Mix 1/4 teaspoon of salt and 1/4 teaspoon of baking soda in 8 oz of water. SEEK MEDICAL CARE IF:   Large, tender lumps develop in your neck.  A rash develops.  A green, yellow-brown, or bloody substance is coughed up.  You are unable to swallow liquids or food for 24 hours.  You notice that only one of the tonsils is swollen. SEEK IMMEDIATE MEDICAL CARE IF:   You develop any new symptoms such as vomiting, severe headache, stiff neck, chest pain, or trouble breathing or swallowing.  You have severe throat pain along with drooling or voice changes.  You have severe pain, unrelieved with recommended  medications.  You are unable to fully open the mouth.  You develop redness, swelling, or severe pain anywhere in the neck.  You have a fever. MAKE SURE YOU:   Understand these instructions.  Will watch your condition.  Will get help right away if you are not doing well or get worse.   This information is not intended to replace advice given to you by your health care provider. Make sure you discuss any questions you have with your health care provider.   Document Released: 07/23/2005 Document Revised: 11/03/2014 Document Reviewed: 04/01/2013 Elsevier Interactive Patient Education 2016 [employer] Otitis Media With Effusion Otitis media with effusion is the presence of fluid in the middle ear. This is a common problem in children, which often follows ear infections. It may be present for weeks or longer after the infection. Unlike an acute ear infection, otitis media with effusion refers only to fluid behind the ear drum and not infection. Children with repeated ear and sinus infections and allergy problems are the most likely to get otitis media with effusion. CAUSES  The most frequent cause of the fluid buildup is dysfunction of the eustachian tubes. These are the tubes that drain fluid in the ears to the back of the nose (nasopharynx). SYMPTOMS   The main symptom of this condition is hearing loss. As a result, you or your child may:  Listen to the TV at a loud volume.  Not respond to questions.  Ask "what" often when spoken to.  Mistake or confuse one sound or word for another.  There may be a sensation of fullness or pressure but usually not pain. DIAGNOSIS   Your health care provider will diagnose this condition by examining you or your child's ears.  Your health care provider may test the pressure in you or your child's ear with a tympanometer.  A hearing test may be conducted if the problem persists. TREATMENT   Treatment depends on the duration and the effects  of the effusion.  Antibiotics, decongestants, nose drops, and cortisone-type drugs (tablets or nasal spray) may not be helpful.  Children with persistent ear effusions may have delayed language or behavioral problems. Children at risk for developmental delays in hearing, learning, and speech may require referral to a specialist earlier than children not at risk.  You or your child's health care provider may suggest a referral to an ear, nose, and throat surgeon for treatment. The following may help restore normal hearing:  Drainage of fluid.  Placement of ear tubes (tympanostomy tubes).  Removal of adenoids (adenoidectomy). HOME CARE INSTRUCTIONS   Avoid secondhand smoke.  Infants who are breastfed are less likely to have this condition.  Avoid feeding infants while they are lying flat.  Avoid known environmental allergens.  Avoid people who are sick.  SEEK MEDICAL CARE IF:   Hearing is not better in 3 months.  Hearing is worse.  Ear pain.  Drainage from the ear.  Dizziness. MAKE SURE YOU:   Understand these instructions.  Will watch your condition.  Will get help right away if you are not doing well or get worse.   This information is not intended to replace advice given to you by your health care provider. Make sure you discuss any questions you have with your health care provider.   Document Released: 11/20/2004 Document Revised: 11/03/2014 Document Reviewed: 05/10/2013 Elsevier Interactive Patient Education [employer]

## 2015-09-08 NOTE — ED Notes (Signed)
Patient presents here with c/o cough/nasal congestion sicne 3 weeks, denies any fever, used the OTC meds with no relief

## 2015-09-08 NOTE — ED Provider Notes (Signed)
CSN: [id number]     Arrival date & time 09/08/15  1051 History   First MD Initiated Contact with Patient 09/08/15 1220     Chief Complaint  Patient presents with  . Cough  . Nasal Congestion   (Consider location/radiation/quality/duration/timing/severity/associated sxs/prior Treatment) HPI Comments: Married caucasian female here for evaluation of sinus congestion, post nasal drip, cough productive yellow green started 1 week after her daughter; teeth lower pain; cheeks pressure denied nausea/vomiting/diarrhea/fever  FHx: grandmother MI  PCM [facility]  LMP 08-14-2015  The history is provided by the patient and a relative.    History reviewed. No pertinent past medical history. History reviewed. No pertinent past surgical history. History reviewed. No pertinent family history. Social History  Substance Use Topics  . Smoking status: Never Smoker   . Smokeless tobacco: None  . Alcohol Use: None   OB History    No data available     Review of Systems  Constitutional: Negative for fever, chills, diaphoresis, activity change, appetite change, fatigue and unexpected weight change.  HENT: Positive for congestion, nosebleeds, postnasal drip, sinus pressure and sore throat. Negative for dental problem, drooling, ear discharge, ear pain, facial swelling, hearing loss, mouth sores, rhinorrhea, sneezing, tinnitus, trouble swallowing and voice change.   Eyes: Negative for photophobia, pain, discharge, redness, itching and visual disturbance.  Respiratory: Positive for cough. Negative for choking, chest tightness, shortness of breath, wheezing and stridor.   Cardiovascular: Negative for chest pain, palpitations and leg swelling.  Gastrointestinal: Negative for nausea, vomiting, abdominal pain, diarrhea, constipation, blood in stool and abdominal distention.  Endocrine: Negative for cold intolerance and heat intolerance.  Genitourinary: Negative for dysuria, hematuria and difficulty  urinating.  Musculoskeletal: Negative for myalgias, back pain, joint swelling, arthralgias, gait problem, neck pain and neck stiffness.  Skin: Negative for color change, pallor, rash and wound.  Allergic/Immunologic: Negative for environmental allergies and food allergies.  Neurological: Positive for headaches. Negative for dizziness, tremors, seizures, syncope, facial asymmetry, speech difficulty, weakness, light-headedness and numbness.  Hematological: Negative for adenopathy. Does not bruise/bleed easily.  Psychiatric/Behavioral: Positive for sleep disturbance. Negative for behavioral problems, confusion and agitation.    Allergies  Review of patient's allergies indicates no known allergies.  Home Medications   Prior to Admission medications   Medication Sig Start Date End Date Taking? Authorizing Provider  acetaminophen (TYLENOL) 500 MG tablet Take 2 tablets (1,000 mg total) by mouth every 6 (six) hours as needed for mild pain, moderate pain, fever or headache. 09/08/15 09/15/15  Barbaraann Barthel, NP  fluticasone (FLONASE) 50 MCG/ACT nasal spray Place 1 spray into both nostrils 2 (two) times daily. 09/08/15   Barbaraann Barthel, NP  sodium chloride (OCEAN) 0.65 % SOLN nasal spray Place 2 sprays into both nostrils every 2 (two) hours while awake. 09/08/15   Barbaraann Barthel, NP   Meds Ordered and Administered this Visit  Medications - No data to display  BP 111/89 mmHg  Pulse 56  Temp(Src) 97.9 F (36.6 C) (Tympanic)  Resp 20  Ht  (1.727 m)  Wt 178 lb (80.74 kg)  BMI 27.07 kg/m2  SpO2 100%  LMP 08/14/2015 (Approximate) No data found.   Physical Exam  Constitutional: She is oriented to person, place, and time. She appears well-developed and well-nourished. She is active and cooperative.  Non-toxic appearance. She does not have a sickly appearance. She appears ill. No distress.  HENT:  Head: Normocephalic and atraumatic.  Right Ear: Hearing, external ear and ear canal  normal. A middle ear effusion is present.  Left Ear: Hearing, external ear and ear canal normal. A middle ear effusion is present.  Nose: Mucosal edema and rhinorrhea present. No nose lacerations, sinus tenderness, nasal deformity, septal deviation or nasal septal hematoma. No epistaxis.  No foreign bodies. Right sinus exhibits maxillary sinus tenderness and frontal sinus tenderness. Left sinus exhibits maxillary sinus tenderness and frontal sinus tenderness.  Mouth/Throat: Uvula is midline and mucous membranes are normal. Mucous membranes are not pale, not dry and not cyanotic. She does not have dentures. No oral lesions. No trismus in the jaw. Normal dentition. No dental abscesses, uvula swelling, lacerations or dental caries. Oropharyngeal exudate, posterior oropharyngeal edema and posterior oropharyngeal erythema present. No tonsillar abscesses.  Bilateral tonsils enlarged erythema scattered erythema 2+/4; cobblestoning posterior pharynx; bilateral maxillary and frontal sinuses TTP; bilateral TMs with air fluid level clear  Eyes: Conjunctivae, EOM and lids are normal. Pupils are equal, round, and reactive to light. Right eye exhibits no chemosis, no discharge, no exudate and no hordeolum. No foreign body present in the right eye. Left eye exhibits no chemosis, no discharge, no exudate and no hordeolum. No foreign body present in the left eye. Right conjunctiva is not injected. Right conjunctiva has no hemorrhage. Left conjunctiva is not injected. Left conjunctiva has no hemorrhage. No scleral icterus. Right eye exhibits normal extraocular motion and no nystagmus. Left eye exhibits normal extraocular motion and no nystagmus. Right pupil is round and reactive. Left pupil is round and reactive. Pupils are equal.  Neck: Trachea normal and normal range of motion. Neck supple. No tracheal tenderness, no spinous process tenderness and no muscular tenderness present. No rigidity. No tracheal deviation, no edema,  no erythema and normal range of motion present. No thyroid mass and no thyromegaly present.  Cardiovascular: Normal rate, regular rhythm, S1 normal, S2 normal, normal heart sounds and intact distal pulses.  PMI is not displaced.  Exam reveals no gallop and no friction rub.   No murmur heard. Pulmonary/Chest: Effort normal and breath sounds normal. No accessory muscle usage or stridor. No respiratory distress. She has no decreased breath sounds. She has no wheezes. She has no rhonchi. She has no rales. She exhibits no tenderness.  Abdominal: Soft. Bowel sounds are normal. She exhibits no shifting dullness, no distension, no pulsatile liver, no fluid wave, no abdominal bruit, no ascites, no pulsatile midline mass and no mass. There is no hepatosplenomegaly. There is no tenderness. There is no rigidity, no rebound, no guarding, no tenderness at McBurney's point and negative Murphy's sign. Hernia confirmed negative in the ventral area.  Dull to percussion x 4 quads  Musculoskeletal: Normal range of motion. She exhibits no edema or tenderness.       Right shoulder: Normal.       Left shoulder: Normal.       Right hip: Normal.       Left hip: Normal.       Right knee: Normal.       Left knee: Normal.       Cervical back: Normal.       Right hand: Normal.       Left hand: Normal.  Lymphadenopathy:       Head (right side): No submental, no submandibular, no tonsillar, no preauricular, no posterior auricular and no occipital adenopathy present.       Head (left side): No submental, no submandibular, no tonsillar, no preauricular, no posterior auricular and no occipital adenopathy present.  She has no cervical adenopathy.       Right cervical: No superficial cervical, no deep cervical and no posterior cervical adenopathy present.      Left cervical: No superficial cervical, no deep cervical and no posterior cervical adenopathy present.  Neurological: She is alert and oriented to person, place, and  time. She has normal strength. She is not disoriented. She displays no atrophy and no tremor. No cranial nerve deficit or sensory deficit. She exhibits normal muscle tone. She displays no seizure activity. Coordination and gait normal. GCS eye subscore is 4. GCS verbal subscore is 5. GCS motor subscore is 6.  Skin: Skin is warm, dry and intact. No abrasion, no bruising, no burn, no ecchymosis, no laceration, no lesion, no petechiae and no rash noted. She is not diaphoretic. No cyanosis or erythema. No pallor. Nails show no clubbing.  Psychiatric: She has a normal mood and affect. Her speech is normal and behavior is normal. Judgment and thought content normal. Cognition and memory are normal.  Nursing note and vitals reviewed.   ED Course  Procedures (including critical care time)  Labs Review Labs Reviewed  RAPID STREP SCREEN (NOT AT [facility])  CULTURE, GROUP A STREP ([facility] ONLY)    Imaging Review No results found.  1324 Discussed rapid strep negative.  Will call with culture results once available typically 48 hours.  Consider antibiotic if no sinus relief with 48 hours of flonase and saline.  Patient verbalized understanding of information/instructions, agreed with plan of care and had no further questions at this time.   MDM   1. Tonsillitis with exudate   2. Acute recurrent maxillary sinusitis   3. Otitis media with effusion, bilateral    Tylenol 1000mg  po QId prn.  Salt water gargles.  May use liquid benadryl and maalox OTC gargle and swallow usual adult dose TID per manufacturer's instructions.  Rapid strep negative will call with throat culture results once available typically 48 hours. flonase 1 spray each nostril bid and nasal saline 2 sprays each nostril q2h while awake prn congestion.  Hydrate.   No evidence of systemic bacterial infection, non toxic and well hydrated.  I do not see where any further testing or imaging is necessary at this time.   I will suggest supportive care,  rest, good hygiene and encourage the patient to take adequate fluids.  The patient is to return to clinic or EMERGENCY ROOM if symptoms worsen or change significantly.  Exitcare handout on sinusitis given to patient.  Patient verbalized agreement and understanding of treatment plan and had no further questions at this time.   P2:  Hand washing and cover cough  Supportive treatment.   No evidence of invasive bacterial infection, non toxic and well hydrated.  This is most likely self limiting viral infection.  I do not see where any further testing or imaging is necessary at this time.   I will suggest supportive care, rest, good hygiene and encourage the patient to take adequate fluids.  The patient is to return to clinic or EMERGENCY ROOM if symptoms worsen or change significantly e.g. ear pain, fever, purulent discharge from ears or bleeding.  Exitcare handout on otitis media with effusion given to patient.  Patient verbalized agreement and understanding of treatment plan.      Usually no specific medical treatment is needed if a virus is causing the sore throat.  The throat most often gets better on its own within 5 to 7 days.  Antibiotic medicine  does not cure viral pharyngitis.   For acute pharyngitis caused by bacteria, your healthcare provider will prescribe an antibiotic.  Marland Kitchen Do not smoke.  Marland Kitchen Avoid secondhand smoke and other air pollutants.  . Use a cool mist humidifier to add moisture to the air.  . Get plenty of rest.  . You may want to rest your throat by talking less and eating a diet that is mostly liquid or soft for a day or two.   Marland Kitchen Nonprescription throat lozenges and mouthwashes should help relieve the soreness.   . Gargling with warm saltwater and drinking warm liquids may help.  (You can make a saltwater solution by adding 1/4 teaspoon of salt to 8 ounces, or 240 mL, of warm water.)  . A nonprescription pain reliever such as aspirin, acetaminophen, or ibuprofen may ease general aches  and pains.   FOLLOW UP with clinic provider if no improvements in the next 7-10 days.  Patient verbalized understanding of instructions and agreed with plan of care. P2:  Hand washing and diet.    Barbaraann Barthel, NP 09/09/15 1736

## 2015-09-10 LAB — CULTURE, GROUP A STREP (THRC)

## 2015-09-10 NOTE — ED Notes (Signed)
Final report of strep testing negative  

## 2015-09-12 ENCOUNTER — Telehealth: Payer: Self-pay | Admitting: Family Medicine

## 2015-09-12 NOTE — Telephone Encounter (Signed)
Telephone message left for patient throat culture normal negative to contact clinic if further questions or concerns.

## 2015-12-05 ENCOUNTER — Encounter: Payer: Self-pay | Admitting: Emergency Medicine

## 2015-12-05 ENCOUNTER — Ambulatory Visit
Admission: EM | Admit: 2015-12-05 | Discharge: 2015-12-05 | Disposition: A | Discharge status: Home / Self Care | Payer: BLUE CROSS/BLUE SHIELD | Attending: Family Medicine | Admitting: Family Medicine

## 2015-12-05 DIAGNOSIS — J069 Acute upper respiratory infection, unspecified: Secondary | ICD-10-CM | POA: Diagnosis not present

## 2015-12-05 DIAGNOSIS — Z20828 Contact with and (suspected) exposure to other viral communicable diseases: Secondary | ICD-10-CM | POA: Diagnosis not present

## 2015-12-05 LAB — RAPID INFLUENZA A&B ANTIGENS
Influenza A (ARMC): NOT DETECTED
Influenza B (ARMC): NOT DETECTED

## 2015-12-05 MED ORDER — OSELTAMIVIR PHOSPHATE 75 MG PO CAPS: 75.0000 mg | ORAL_CAPSULE | Freq: Every day | ORAL | Status: AC

## 2015-12-05 NOTE — ED Provider Notes (Signed)
[facility]  ____________________________________________  Time seen: Approximately 5:11 PM  I have reviewed the triage vital signs and the nursing notes.   HISTORY  Chief Complaint Nasal Congestion  HPI Gina Le is a 35 y.o. female presents for request of wanting to be evaluated for influenza. Patient states that her husband was tested positive for influenza A today. Patient states she feels well and does not have any symptoms or complaints. States she just wanted to be evaluated and tested for influenza A.   LMP: 1 week ago, denies chance of pregnancy.    History reviewed. No pertinent past medical history.  Patient Active Problem List   Diagnosis Date Noted  . 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 No pertinent past surgical history.  Current Outpatient Rx  Name  Route  Sig  Dispense  Refill  .           Marland Kitchen             Allergies Review of patient's allergies indicates no known allergies.  History reviewed. No pertinent family history.  Social History Social History  Substance Use Topics  . Smoking status: Never Smoker   . Smokeless tobacco: None  . Alcohol Use: Yes    Review of Systems Constitutional: No fever/chills Eyes: No visual changes. ENT: No sore throat. Denies cough, congestion, sore throat.  Cardiovascular: Denies chest pain. Respiratory: Denies shortness of breath. Gastrointestinal: No abdominal pain.  No nausea, no vomiting.  No diarrhea.  No constipation. Genitourinary: Negative for dysuria. Musculoskeletal: Negative for back pain. Skin: Negative for rash. Neurological: Negative for headaches, focal weakness or numbness.  10-point ROS otherwise negative.  ____________________________________________   PHYSICAL EXAM:  VITAL SIGNS: ED Triage Vitals  Enc Vitals Group     BP 12/05/15 1631 120/90 mmHg     Pulse Rate 12/05/15 1631 68     Resp 12/05/15 1631 16     Temp 12/05/15 1631 98.3  F (36.8 C)     Temp Source 12/05/15 1631 Oral     SpO2 12/05/15 1631 100 %     Weight 12/05/15 1631 185 lb (83.915 kg)     Height 12/05/15 1631  (1.727 m)     Head Cir --      Peak Flow --      Pain Score 12/05/15 1633 0     Pain Loc --      Pain Edu? --      Excl. in GC? --     Constitutional: Alert and oriented. Well appearing and in no acute distress. Eyes: Conjunctivae are normal. PERRL. EOMI. Head: Atraumatic. Nontender, no erythema, no swelling.   Ears: no erythema, normal TMs bilaterally.   Nose: No congestion/rhinnorhea.  Mouth/Throat: Mucous membranes are moist.  Oropharynx non-erythematous. Neck: No stridor.  No cervical spine tenderness to palpation. Hematological/Lymphatic/Immunilogical: No cervical lymphadenopathy. Cardiovascular: Normal rate, regular rhythm. Grossly normal heart sounds.  Good peripheral circulation. Respiratory: Normal respiratory effort.  No retractions. Lungs CTAB. Gastrointestinal: Soft and nontender.. Musculoskeletal: No cervical, thoracic or lumbar tenderness to palpation.  Neurologic:  Normal speech and language. No gross focal neurologic deficits are appreciated. No gait instability. Skin:  Skin is warm, dry and intact. No rash noted. Psychiatric: Mood and affect are normal. Speech and behavior are normal.  ____________________________________________   LABS (all labs ordered are listed, but only abnormal results are displayed)  Labs Reviewed  RAPID INFLUENZA A&B ANTIGENS ([facility] ONLY)  INITIAL IMPRESSION / ASSESSMENT AND PLAN / ED COURSE  Pertinent labs & imaging results that were available during my care of the patient were reviewed by me and considered in my medical decision making (see chart for details).  Very well-appearing patient. No acute distress. Denies complaints. Presents for request of influenza testing. Well exam. Influenza test negative. As patient testing positive today, will place patient on prophylactic dose of  tamiflu of one tablet once a day for 10 days.  Discussed follow up with Primary care physician this week. Discussed follow up and return parameters including no resolution or any worsening concerns. Patient verbalized understanding and agreed to plan.   ____________________________________________   FINAL CLINICAL IMPRESSION(S) / ED DIAGNOSES  Final diagnoses:  Exposure to influenza      Note: This dictation was prepared with Dragon dictation along with smaller phrase technology. Any transcriptional errors that result from this process are unintentional.    Renford Dills, NP 12/05/15 1737

## 2015-12-05 NOTE — ED Notes (Signed)
Patient denies any cold symptoms at this time.  Patient states that her husband has been diagnosed with the flu today.

## 2015-12-05 NOTE — Discharge Instructions (Signed)
Take medication as prescribed. Rest. Drink plenty of fluids.   Follow up with your primary care physician this week as needed. Return to Urgent care for new or worsening concerns.   Infection Control in the Home If you have an infection or you are taking care of someone who has an infection, it is important to know how to keep the infection from spreading. Follow these guidelines to help stop the spread of infection, and talk to your health care provider. HOW ARE INFECTIONS SPREAD? In order for an infection to spread, the following must be present:  A germ. This may be a virus, bacteria, fungus, or parasite.  A place for the germ to live. This may be:  On or in a person, animal, plant, or food.  In soil or water.  On surfaces, such as a door handle.  A susceptible host. This is a person or animal who does not have resistance (immunity) to the germ.  A way for the germ to enter the host. This may occur by:  Direct contact. This may happen by making contact--such as shaking hands or hugging--with an infected person or animal. Some germs can also travel through the air and spread to you if an infected person coughs or sneezes on you or near you.  Indirect contact. This is when the germ enters the host through contact with an infected object. Examples include eating contaminated food, drinking contaminated water, or touching a contaminated surface with your hands and then touching your face, nose, or mouth soon after that. HOW CAN I HELP TO PREVENT INFECTION FROM SPREADING? There are several things that you can do to help prevent infection from spreading. Hand Washing It is very important to wash your hands correctly, following these steps:  Wet your hands with clean, running water.  Apply soap to your hands. Liquid soap is better than bar soap.  Rub your hands together quickly to create lather.  Keep rubbing your hands together for at least 20 seconds. Thoroughly scrub all parts of  your hands, including under your fingernails and between your fingers.  Rinse your hands with clean, running water until all of the soap is gone.  Dry your hands with an air dryer or a clean paper or cloth towel, or let your hands air-dry. Do not use your clothing or a soiled towel to dry your hands.  If you are in a public restroom, use your towel to turn off the water faucet and to open the bathroom door. Make sure to wash your hands:  Before:  Visiting a baby or anyone with a weakened or lowered defense (immune) system.  Putting in and taking out any contact lenses.  After:  Working or playing outside.  Touching an animal or its toys or leash.  Handling livestock.  Using the bathroom or helping a child or adult to use the bathroom.  Using household cleaners or toxic chemicals.  Touching or taking out the garbage.  Touching anything dirty around your home.  Handling soiled clothes or rags.  Taking care of a sick child. This includes touching used tissues, toys, and clothes.  Sneezing, coughing, or blowing your nose.  Using public transportation.  Shaking hands.  Using a phone, including your mobile phone.  Touching money.  Before and after:  Preparing food.  Preparing a bottle for a baby.  Feeding a baby or a young child.  Eating.  Visiting or taking care of someone who is sick.  Changing a diaper.  Changing a bandage (dressing) or taking care of an injury or wound.  Giving or taking medicine. Taking Care of Your Home  Make sure that you have enough cleaning supplies at all times. These include:  Disinfectants.  Reusable cleaning cloths. Wash these after each use.  Paper towels.  Utility gloves. Replace your gloves if they are cracked or torn or if they start to peel.  Use bleach safely. Never mix it with other cleaning products, especially those that contain ammonia. This mixture can create a dangerous gas that may be deadly.  Take care  of your cleaning supplies. Toilet brushes, mops, and sponges can breed germs. Soak them in bleach and water for 5 minutes after each use.  Do not pour used mop water down the sink. Pour it down the toilet instead.  Maintain proper ventilation in your home.  If you have a pet, ensure that your pet stays clean. Do not let people with weak immune systems touch bird droppings, fish tank water, or a litter box.  If you have a cat, be sure to change the litter every day.  In the bathroom, make sure you:  Provide liquid soap.  Change towels and washcloths frequently. Avoid sharing towels and washcloths.  Change toothbrushes often and store them separately in a clean, dry place.  Disinfect the toilet.  Clean the tub, shower, and sink with standard cleaning products.   Mop the floor with a standard cleaner.  Do not share personal items, such as razors, toothbrushes, drinking glasses, deodorant, combs, brushes, towels, and washcloths.   In the kitchen, make sure you:  Store food carefully.  Refrigerate leftovers promptly in covered containers.  Throw out stale or spoiled food.  Clean the inside of your refrigerator each week.  Keep your refrigerator set at 39F (4C) or less, and set your freezer at 2F (-18C) or less.  Thaw foods in the refrigerator or microwave, not at room temperature.  Serve foods at the proper temperature. Do not eat raw meat. Make sure it is cooked to the appropriate temperature.Cook eggs until they are firm.  Wash fruits and vegetables under running water.  Use separate cutting boards, plates, and utensils for raw foods and cooked foods.  Keep work surfaces clean.  Use a clean spoon each time you sample food while cooking.  Wash your dishes in hot, soapy water. Air-dry your dishes or use a dishwasher.  Do not share forks, cups, or spoons during meals.  Wear gloves if laundry is visibly soiled.  Change linens each week or whenever they are  soiled.  Do not shake soiled linens. Doing that may send germs into the air. Put dressings, sanitary or incontinence pads, diapers, and gloves in plastic garbage bags for disposal.   This information is not intended to replace advice given to you by your health care provider. Make sure you discuss any questions you have with your health care provider.   Document Released: 07/22/2008 Document Revised: 11/03/2014 Document Reviewed: 06/15/2014 Elsevier Interactive Patient Education 2016 [employer]  Droplet Precautions Droplet precautions are rules that help to keep germs from the mouth, lungs, throat, or airways from spreading from one person to another person. RULES FOR PATIENTS  Check with your nurse before you leave the room where you are being treated.  Wear a mask if you go to another part of the hospital or clinic. Make sure the mask fits tightly.  Cover your mouth with a tissue when you cough.  Cover your nose  with a tissue when you sneeze.  Try to stay 3 feet or more away from another person while talking.  Wash your hands often. RULES FOR VISITORS  Check with a nurse before you go into a room that has a sign that says "Droplet Precautions." If you can go in, wash your hands and wear a mask over your nose and mouth. Make sure the mask fits tightly. Do not take it off in the room. You may also be told to wear eye protection.  If a nurse says that you can go in, wash your hands and wear a mask over your nose and mouth. Make sure the mask fits tightly. Do not take it off in the room. You may also be told to wear eye protection.  Do not take off your mask in the room. If you were told to wear eye protection, do not take it off in the room.  Do not eat or drink in the room unless you ask a nurse first.  Do not touch anything in the room unless you ask a nurse first.  Right after you leave the room, take off your mask and throw it in the trash. If you were told to wear eye  protection, take it off and throw it in the trash. Then wash your hands.   This information is not intended to replace advice given to you by your health care provider. Make sure you discuss any questions you have with your health care provider.   Document Released: 11/15/2010 Document Revised: 02/27/2015 Document Reviewed: 09/20/2014 Elsevier Interactive Patient Education [employer]

## 2017-01-12 ENCOUNTER — Telehealth: Payer: Self-pay | Admitting: Obstetrics and Gynecology

## 2017-01-12 ENCOUNTER — Other Ambulatory Visit: Payer: Self-pay

## 2017-01-12 DIAGNOSIS — Z309 Encounter for contraceptive management, unspecified: Secondary | ICD-10-CM

## 2017-01-12 MED ORDER — TRINESSA (28) 0.18/0.215/0.25 MG-35 MCG PO TABS: 1.0000 | ORAL_TABLET | Freq: Every day | ORAL | 0 refills | Status: DC

## 2017-01-12 NOTE — Telephone Encounter (Signed)
Pt aware medication has been refilled  

## 2017-01-12 NOTE — Telephone Encounter (Signed)
Patient called and scheduled her annual with Dr. Jean RosenthalJackson on May 14, however she will need a refill on her bc to last until then.  Please advise pt.   [phone]

## 2017-03-05 ENCOUNTER — Other Ambulatory Visit: Payer: Self-pay

## 2017-03-05 DIAGNOSIS — Z309 Encounter for contraceptive management, unspecified: Secondary | ICD-10-CM

## 2017-03-05 MED ORDER — TRINESSA (28) 0.18/0.215/0.25 MG-35 MCG PO TABS: 1.0000 | ORAL_TABLET | Freq: Every day | ORAL | 2 refills | Status: DC

## 2017-03-05 NOTE — Telephone Encounter (Signed)
Pt calling for refill of trinessa.  She will be out Saturday and has sched. appt for 7/23rd.  Pt aware refill eRx'd.

## 2017-03-09 ENCOUNTER — Ambulatory Visit: Payer: Self-pay | Admitting: Obstetrics and Gynecology

## 2017-05-18 ENCOUNTER — Ambulatory Visit (INDEPENDENT_AMBULATORY_CARE_PROVIDER_SITE_OTHER): Payer: BLUE CROSS/BLUE SHIELD | Admitting: Obstetrics and Gynecology

## 2017-05-18 ENCOUNTER — Encounter: Payer: Self-pay | Admitting: Obstetrics and Gynecology

## 2017-05-18 VITALS — BP 118/70 | Ht 68.0 in | Wt 180.0 lb

## 2017-05-18 DIAGNOSIS — Z3041 Encounter for surveillance of contraceptive pills: Secondary | ICD-10-CM | POA: Diagnosis not present

## 2017-05-18 DIAGNOSIS — Z1389 Encounter for screening for other disorder: Secondary | ICD-10-CM | POA: Diagnosis not present

## 2017-05-18 DIAGNOSIS — Z01419 Encounter for gynecological examination (general) (routine) without abnormal findings: Secondary | ICD-10-CM | POA: Diagnosis not present

## 2017-05-18 DIAGNOSIS — Z1339 Encounter for screening examination for other mental health and behavioral disorders: Secondary | ICD-10-CM

## 2017-05-18 DIAGNOSIS — Z1331 Encounter for screening for depression: Secondary | ICD-10-CM

## 2017-05-18 NOTE — Progress Notes (Signed)
Gynecology Annual Exam  PCP: Patient, No Pcp Per  Chief Complaint  Patient presents with  . Annual Exam    History of Present Illness:  Ms. Gina Le is a 36 y.o. [phone] who LMP was Patient's last menstrual period was 04/26/2017., presents today for her annual examination.  Her menses are regular every 28-30 days, lasting 4 day(s).  Dysmenorrhea none. She does not have intermenstrual bleeding.  She is single partner, contraception - OCP (estrogen/progesterone).  Last Pap: 1 year, NILM, HPV neg Hx of STDs: none  Last mammogram: n/a There is no FH of breast cancer. There is no FH of ovarian cancer. The patient does do self-breast exams.  Tobacco use: The patient denies current or previous tobacco use. Alcohol use: social drinker Exercise: very active  The patient wears seatbelts: yes.      Past Medical History:  Diagnosis Date  . Anemia   . Heart murmur    Past Surgical Histor: Denies    Medication Sig Start Date End Date Taking? Authorizing Provider  TRINESSA, 26, 0.18/0.215/0.25 MG-35 MCG tablet Take 1 tablet by mouth daily. 03/05/17 04/04/17  Conard NovakJackson, Braedon Sjogren D, MD   Allergies: No Known Allergies  Gynecologic History: Patient's last menstrual period was 04/26/2017.  Obstetric History: [id number]  Social History   Social History  . Marital status: Divorced    Spouse name: N/A  . Number of children: N/A  . Years of education: N/A   Occupational History  . Not on file.   Social History Main Topics  . Smoking status: Never Smoker  . Smokeless tobacco: Never Used  . Alcohol use Yes  . Drug use: Unknown  . Sexual activity: Yes    Birth control/ protection: Pill   Other Topics Concern  . Not on file   Social History Narrative  . No narrative on file   Family History: Denies history of gynecologic cancer  Review of Systems  Constitutional: Negative.   HENT: Negative.   Eyes: Negative.   Respiratory: Negative.   Cardiovascular: Negative.    Gastrointestinal: Negative.   Genitourinary: Negative.   Musculoskeletal: Negative.   Skin: Negative.   Neurological: Negative.   Psychiatric/Behavioral: Negative.      Physical Exam BP 118/70   Ht 5\' 8"  (1.727 m)   Wt 180 lb (81.6 kg)   LMP 04/26/2017   BMI 27.37 kg/m   Physical Exam  Constitutional: She is oriented to person, place, and time. She appears well-developed and well-nourished. No distress.  Genitourinary: Vagina normal and uterus normal. Pelvic exam was performed with patient supine. There is no rash, tenderness, lesion or injury on the right labia. There is no rash, tenderness, lesion or injury on the left labia. Right adnexum does not display mass, does not display tenderness and does not display fullness. Left adnexum does not display mass, does not display tenderness and does not display fullness.  Cervix is parous. Cervix does not exhibit motion tenderness, lesion or polyp.   Uterus is mobile. Uterus is not enlarged, tender, exhibiting a mass or irregular (is regular).  HENT:  Head: Normocephalic and atraumatic.  Eyes: EOM are normal. No scleral icterus.  Neck: Normal range of motion. Neck supple. No thyromegaly present.  Cardiovascular: Normal rate and regular rhythm.   Pulmonary/Chest: Effort normal and breath sounds normal. No respiratory distress. She has no wheezes. She has no rales.  Abdominal: Soft. Bowel sounds are normal. She exhibits no distension and no mass. There is no tenderness. There is no  rebound and no guarding.  Musculoskeletal: Normal range of motion. She exhibits no edema.  Lymphadenopathy:    She has no cervical adenopathy.  Neurological: She is alert and oriented to person, place, and time. No cranial nerve deficit.  Skin: Skin is warm and dry. No erythema.  Psychiatric: She has a normal mood and affect. Her behavior is normal. Judgment normal.   Female chaperone present for pelvic and breast  portions of the physical  exam  Results: AUDIT Questionnaire (screen for alcoholism): 0 PHQ-9: 0   Assessment: 36 y.o. G52P3003 female here for routine annual gynecologic examination.  Plan: Problem List Items Addressed This Visit    None    Visit Diagnoses    Women's annual routine gynecological examination    -  Primary   wants Nexplanon   Screening for depression       Screening for alcohol problem       Encounter for surveillance of contraceptive pills         Screening: -- Blood pressure screen normal -- Colonoscopy - not due -- Mammogram - not due -- Weight screening: overweight: continue to monitor -- Depression screening negative (PHQ-9) -- Nutrition: normal -- cholesterol screening: not due for screening -- osteoporosis screening: not due -- tobacco screening: not using -- alcohol screening: AUDIT questionnaire indicates low-risk usage. -- family history of breast cancer screening: done. not at high risk. -- no evidence of domestic violence or intimate partner violence. -- STD screening: gonorrhea/chlamydia NAAT not collected per patient request. -- pap smear not collected per ASCCP guidelines -- HPV vaccination series: not eligilbe  Patient desires Nexplanon for contraceptive. Non available to place today.  Will have her schedule placement at return visit.   Thomasene Mohair, MD 05/18/2017 9:34 AM

## 2017-05-29 ENCOUNTER — Ambulatory Visit (INDEPENDENT_AMBULATORY_CARE_PROVIDER_SITE_OTHER): Payer: BLUE CROSS/BLUE SHIELD | Admitting: Obstetrics and Gynecology

## 2017-05-29 ENCOUNTER — Encounter: Payer: Self-pay | Admitting: Obstetrics and Gynecology

## 2017-05-29 VITALS — BP 118/74 | Ht 68.0 in | Wt 178.0 lb

## 2017-05-29 DIAGNOSIS — Z30017 Encounter for initial prescription of implantable subdermal contraceptive: Secondary | ICD-10-CM | POA: Diagnosis not present

## 2017-05-29 MED ORDER — ETONOGESTREL 68 MG ~~LOC~~ IMPL: 68.0000 mg | DRUG_IMPLANT | Freq: Once | SUBCUTANEOUS | Status: AC

## 2017-05-29 NOTE — Progress Notes (Signed)
   GYNECOLOGY PROCEDURE NOTE  Patient is a 36 y.o. [id number] presenting for Nexplanon insertion as her desires means of contraception.  She provided informed consent, signed copy in the chart, time out was performed. Pregnancy test was negative, with self reported LMP of 05/26/17.   She understands that Nexplanon is a progesterone only therapy, and that patients often patients have irregular and unpredictable vaginal bleeding or amenorrhea. She understands that other side effects are possible related to systemic progesterone, including but not limited to, headaches, breast tenderness, nausea, and irritability. While effective at preventing pregnancy long acting reversible contraceptives do not prevent transmission of sexually transmitted diseases and use of barrier methods for this purpose was discussed. The placement procedure for Nexplanon was reviewed with the patient in detail including risks of nerve injury, infection, bleeding and injury to other muscles or tendons. She understands that the Nexplanon implant is good for 3 years and needs to be removed at the end of that time.  She understands that Nexplanon is an extremely effective option for contraception, with failure rate of <1%. This information is reviewed today and all questions were answered. Informed consent was obtained, both verbally and written.   The patient is healthy and has no contraindications to Implanon use. Urine pregnancy test was performed today and was negative.  Procedure Appropriate time out taken.  Patient placed in dorsal supine with left arm above head, elbow flexed at 90 degrees, arm resting on examination table.  The bicipital grove was palpated and site 8-10cm proximal to the medial epicondyle was indentified . The insertion site was prepped with a two betadine swabs and then injected with 3 cc of 2% lidocaine without epinephrine.  Nexplanon removed form sterile blister packaging,  Device confirmed in needle, before  inserting full length of needle, tenting up the skin as the needle was advance.  The drug eluting rod was then deployed by pulling back the slider per the manufactures recommendation.  The implant was palpable by the clinician as well as the patient.  The insertion site covered dressed with a band aid before applying  a kerlex bandage pressure dressing..Minimal blood loss was noted during the procedure.  The patientt tolerated the procedure well.   She was instructed to wear the bandage for 24 hours, call with any signs of infection.  She was given the Implanon card and instructed to have the rod removed in 3 years.  Thomasene MohairStephen Naoko Diperna, MD 05/29/2017 10:36 AM

## 2021-02-04 ENCOUNTER — Other Ambulatory Visit: Payer: Self-pay

## 2021-02-04 ENCOUNTER — Encounter: Payer: Self-pay | Admitting: Emergency Medicine

## 2021-02-04 ENCOUNTER — Ambulatory Visit
Admission: EM | Admit: 2021-02-04 | Discharge: 2021-02-04 | Disposition: A | Discharge status: Home / Self Care | Payer: BC Managed Care – PPO

## 2021-02-04 DIAGNOSIS — H5789 Other specified disorders of eye and adnexa: Secondary | ICD-10-CM

## 2021-02-04 NOTE — ED Triage Notes (Signed)
Patient states yesterday she was cleaning her tub using vinegar and basking soda. She states this got into her right eye and started stinging.

## 2021-02-04 NOTE — Discharge Instructions (Addendum)
No sign of any foreign bodies or major injury to the eye.  No sign of infection.  Continue to flush the eye as needed with the clear eyes.  If you have any discolored drainage from the eye or vision problems, call or return to clinic.

## 2021-02-04 NOTE — ED Provider Notes (Signed)
[facility]    CSN: [id number] Arrival date & time: 02/04/21  1435      History   Chief Complaint Chief Complaint  Patient presents with  . Eye Problem    HPI Gina Le is a 40 y.o. female presenting for concerns about right eye problem.  Patient states that she was cleaning her tub out using vinegar and baking soda yesterday.  She states that she was using a snake to clean the drain and when she pulled it out some of the vinegar and baking soda mixture splashed in her eye.  She said that she flushed the eye out with saline and water and has been using over-the-counter Clear Eyes since.  She says it was initially painful but is no longer causing her pain.  She says that it feels "sticky" now.  She said that there was a small black speck in her eye that she was able to get out yesterday.  She denies any light sensitivity, headaches, dizziness, nausea or vomiting.  Does not wear contacts or glasses.  No vision changes.  No other complaints or concerns.  HPI  Past Medical History:  Diagnosis Date  . Anemia   . Heart murmur     Patient Active Problem List   Diagnosis Date Noted  . 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 No pertinent surgical history.  OB History    Gravida  3   Para  3   Term  3   Preterm      AB      Living  3     SAB      IAB      Ectopic      Multiple      Live Births  3            Home Medications    Prior to Admission medications   Not on File    Family History Family History  Problem Relation Age of Onset  . Healthy Mother   . Healthy Father     Social History Social History   Tobacco Use  . Smoking status: Never Smoker  . Smokeless tobacco: Never Used  Vaping Use  . Vaping Use: Never used  Substance Use Topics  . Alcohol use: Yes  . Drug use: Never     Allergies   Patient has no known allergies.   Review of Systems Review of Systems  Constitutional:  Negative for fatigue.  Eyes: Negative for photophobia, pain, discharge, redness, itching and visual disturbance.  Gastrointestinal: Negative for nausea and vomiting.  Neurological: Negative for dizziness, weakness and light-headedness.     Physical Exam Triage Vital Signs ED Triage Vitals  Enc Vitals Group     BP 02/04/21 1444 126/88     Pulse Rate 02/04/21 1444 67     Resp 02/04/21 1444 18     Temp 02/04/21 1444 98.2 F (36.8 C)     Temp Source 02/04/21 1444 Oral     SpO2 02/04/21 1444 100 %     Weight 02/04/21 1443 177 lb 14.6 oz (80.7 kg)     Height 02/04/21 1443 5\' 8"  (1.727 m)     Head Circumference --      Peak Flow --      Pain Score 02/04/21 1442 0     Pain Loc --      Pain Edu? --      Excl. in GC? --  No data found.  Updated Vital Signs BP 126/88 (BP Location: Left Arm)   Pulse 67   Temp 98.2 F (36.8 C) (Oral)   Resp 18   Ht 5\' 8"  (1.727 m)   Wt 177 lb 14.6 oz (80.7 kg)   LMP 02/01/2021   SpO2 100%   BMI 27.05 kg/m   Visual Acuity Right Eye Distance: 20/25 Left Eye Distance: 20/25 Bilateral Distance: 20/20  Right Eye Near:   Left Eye Near:    Bilateral Near:     Physical Exam Vitals and nursing note reviewed.  Constitutional:      General: She is not in acute distress.    Appearance: Normal appearance. She is not ill-appearing or toxic-appearing.  HENT:     Head: Normocephalic and atraumatic.  Eyes:     General: Lids are normal. Lids are everted, no foreign bodies appreciated. Vision grossly intact. No scleral icterus.       Right eye: No discharge.        Left eye: No discharge.     Extraocular Movements: Extraocular movements intact.     Conjunctiva/sclera: Conjunctivae normal.     Right eye: Right conjunctiva is not injected.  Cardiovascular:     Rate and Rhythm: Normal rate.  Pulmonary:     Effort: Pulmonary effort is normal. No respiratory distress.  Musculoskeletal:     Cervical back: Neck supple.  Skin:    General: Skin is  dry.  Neurological:     General: No focal deficit present.     Mental Status: She is alert. Mental status is at baseline.     Motor: No weakness.     Gait: Gait normal.  Psychiatric:        Mood and Affect: Mood normal.        Behavior: Behavior normal.        Thought Content: Thought content normal.      UC Treatments / Results  Labs (all labs ordered are listed, but only abnormal results are displayed) Labs Reviewed - No data to display  EKG   Radiology No results found.  Procedures Procedures (including critical care time)  Medications Ordered in UC Medications - No data to display  Initial Impression / Assessment and Plan / UC Course  I have reviewed the triage vital signs and the nursing notes.  Pertinent labs & imaging results that were available during my care of the patient were reviewed by me and considered in my medical decision making (see chart for details).   40 year old female presenting for eye irritation following minor injury yesterday.  She says she got a splash of vinegar/baking soda mixture in the eye while cleaning tub.  Exam is within normal limits today no evidence of foreign bodies, normal vision, no evidence of abrasions or infection.  Reviewed this with patient and advised her of supportive care at this time and to monitor condition.  Advised to follow-up with 45 as needed for any worsening symptoms or if not starting to improve over the next week.  ED precautions reviewed.   Final Clinical Impressions(s) / UC Diagnoses   Final diagnoses:  Eye irritation     Discharge Instructions     No sign of any foreign bodies or major injury to the eye.  No sign of infection.  Continue to flush the eye as needed with the clear eyes.  If you have any discolored drainage from the eye or vision problems, call or return to clinic.  ED Prescriptions    None     PDMP not reviewed this encounter.   Shirlee Latch, PA-C 02/04/21 1520
